# Patient Record
Sex: Female | Born: 2000
Health system: Southern US, Community
[De-identification: ages and names within clinical notes are randomized; demographics above are authoritative.]

## PROBLEM LIST (undated history)

## (undated) DIAGNOSIS — R45851 Suicidal ideations: Secondary | ICD-10-CM

## (undated) DIAGNOSIS — M5136 Other intervertebral disc degeneration, lumbar region: Secondary | ICD-10-CM

## (undated) DIAGNOSIS — Z8719 Personal history of other diseases of the digestive system: Secondary | ICD-10-CM

## (undated) DIAGNOSIS — K279 Peptic ulcer, site unspecified, unspecified as acute or chronic, without hemorrhage or perforation: Secondary | ICD-10-CM

## (undated) DIAGNOSIS — Z8669 Personal history of other diseases of the nervous system and sense organs: Secondary | ICD-10-CM

## (undated) HISTORY — PX: ESOPHAGOGASTRODUODENOSCOPY: SHX1529

## (undated) HISTORY — DX: Other intervertebral disc degeneration, lumbar region: M51.36

## (undated) HISTORY — DX: Personal history of other diseases of the digestive system: Z87.19

## (undated) HISTORY — PX: WISDOM TOOTH EXTRACTION: SHX21

## (undated) HISTORY — PX: CHOLECYSTECTOMY: SHX55

## (undated) HISTORY — DX: Peptic ulcer, site unspecified, unspecified as acute or chronic, without hemorrhage or perforation: K27.9

## (undated) HISTORY — DX: Suicidal ideations: R45.851

---
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
## 2018-01-03 ENCOUNTER — Ambulatory Visit (INDEPENDENT_AMBULATORY_CARE_PROVIDER_SITE_OTHER): Payer: BLUE CROSS/BLUE SHIELD | Admitting: Physician Assistant

## 2018-01-03 ENCOUNTER — Encounter: Payer: Self-pay | Admitting: Physician Assistant

## 2018-01-03 VITALS — BP 116/79 | HR 91 | Wt 144.0 lb

## 2018-01-03 DIAGNOSIS — F418 Other specified anxiety disorders: Secondary | ICD-10-CM

## 2018-01-03 DIAGNOSIS — F322 Major depressive disorder, single episode, severe without psychotic features: Secondary | ICD-10-CM | POA: Diagnosis not present

## 2018-01-03 DIAGNOSIS — Z7689 Persons encountering health services in other specified circumstances: Secondary | ICD-10-CM

## 2018-01-03 DIAGNOSIS — Z8719 Personal history of other diseases of the digestive system: Secondary | ICD-10-CM | POA: Insufficient documentation

## 2018-01-03 DIAGNOSIS — R45851 Suicidal ideations: Secondary | ICD-10-CM | POA: Insufficient documentation

## 2018-01-03 DIAGNOSIS — R1013 Epigastric pain: Secondary | ICD-10-CM | POA: Diagnosis not present

## 2018-01-03 DIAGNOSIS — F17200 Nicotine dependence, unspecified, uncomplicated: Secondary | ICD-10-CM | POA: Insufficient documentation

## 2018-01-03 DIAGNOSIS — Z23 Encounter for immunization: Secondary | ICD-10-CM

## 2018-01-03 DIAGNOSIS — Z8669 Personal history of other diseases of the nervous system and sense organs: Secondary | ICD-10-CM | POA: Insufficient documentation

## 2018-01-03 DIAGNOSIS — F17290 Nicotine dependence, other tobacco product, uncomplicated: Secondary | ICD-10-CM | POA: Diagnosis not present

## 2018-01-03 HISTORY — DX: Suicidal ideations: R45.851

## 2018-01-03 MED ORDER — CLONAZEPAM 0.5 MG PO TABS: 0.5000 mg | ORAL_TABLET | Freq: Two times a day (BID) | ORAL | 0 refills | Status: DC | PRN

## 2018-01-03 MED ORDER — ESCITALOPRAM OXALATE 5 MG PO TABS
ORAL_TABLET | ORAL | 0 refills | Status: DC
Start: 2018-01-03 — End: 2018-01-30
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
## 2018-01-03 NOTE — Patient Instructions (Signed)
Safety Plan: if having self-harm or suicidal thoughts Contact [facility] [phone] select option for triage [facility] Crisis Hotline 3808889953 National Suicide Hotline 1-800-SUICIDE If in immediate danger of harming yourself, go to the nearest emergency room or call 911    Suicidal Feelings: How to Help Yourself Suicide is the taking of one's own life. If you feel as though life is getting too tough to handle and are thinking about suicide, get help right away. To get help:  Call your local emergency services (911 in the [place]  Call a suicide hotline to speak with a trained counselor who understands how you are feeling. The following is a list of suicide hotlines in the [place]. For a list of hotlines in [place], visit InkDistributor.it. ? 1-800-273-TALK 701-707-6048). ? 1-800-SUICIDE 647-343-5880). ? 620-451-4131. This is a hotline for Spanish speakers. ? 1-800-799-4TTY (337)257-6802). This is a hotline for TTY users. ? 1-866-4-U-TREVOR (915)821-1998). This is a hotline for lesbian, gay, bisexual, transgender, or questioning youth.  Contact a crisis center or a local suicide prevention center. To find a crisis center or suicide prevention center: ? Call your local hospital, clinic, community service organization, mental health center, social service provider, or health department. Ask for assistance in connecting to a crisis center. ? Visit https://www.patel-king.com/ for a list of crisis centers in the [place], or visit www.suicideprevention.ca/thinking-about-suicide/find-a-crisis-centre for a list of centers in [place].  Visit the following websites: ? National Suicide Prevention Lifeline: www.suicidepreventionlifeline.org ? Hopeline: www.hopeline.com ? [employer] for Suicide Prevention: https://www.ayers.com/ ? [employer] (for lesbian, gay, bisexual, transgender, or  questioning youth): www.thetrevorproject.org  How can I help myself feel better?  Promise yourself that you will not do anything drastic when you have suicidal feelings. Remember, there is hope. Many people have gotten through suicidal thoughts and feelings, and you will, too. You may have gotten through them before, and this proves that you can get through them again.  Let family, friends, teachers, or counselors know how you are feeling. Try not to isolate yourself from those who care about you. Remember, they will want to help you. Talk with someone every day, even if you do not feel sociable. Face-to-face conversation is best.  Call a mental health professional and see one regularly.  Visit your primary health care provider every year.  Eat a well-balanced diet, and space your meals so you eat regularly.  Get plenty of rest.  Avoid alcohol and drugs, and remove them from your home. They will only make you feel worse.  If you are thinking of taking a lot of medicine, give your medicine to someone who can give it to you one day at a time. If you are on antidepressants and are concerned you will overdose, let your health care provider know so he or she can give you safer medicines. Ask your mental health professional about the possible side effects of any medicines you are taking.  Remove weapons, poisons, knives, and anything else that could harm you from your home.  Try to stick to routines. Follow a schedule every day. Put self-care on your schedule.  Make a list of realistic goals, and cross them off when you achieve them. Accomplishments give a sense of worth.  Wait until you are feeling better before doing the things you find difficult or unpleasant.  Exercise if you are able. You will feel better if you exercise for even a half hour each day.  Go out in the sun or into nature. This  will help you recover from depression faster. If you have a favorite place to walk, go  there.  Do the things that have always given you pleasure. Play your favorite music, read a good book, paint a picture, play your favorite instrument, or do anything else that takes your mind off your depression if it is safe to do.  Keep your living space well lit.  When you are feeling well, write yourself a letter about tips and support that you can read when you are not feeling well.  Remember that life's difficulties can be sorted out with help. Conditions can be treated. You can work on thoughts and strategies that serve you well. This information is not intended to replace advice given to you by your health care provider. Make sure you discuss any questions you have with your health care provider. Document Released: 08/28/2002 Document Revised: 10/21/2015 Document Reviewed: 06/18/2013 Elsevier Interactive Patient Education  [employer].

## 2018-01-07 ENCOUNTER — Encounter: Payer: Self-pay | Admitting: Physician Assistant

## 2018-01-08 ENCOUNTER — Encounter: Payer: Self-pay | Admitting: Physician Assistant

## 2018-01-10 ENCOUNTER — Ambulatory Visit (INDEPENDENT_AMBULATORY_CARE_PROVIDER_SITE_OTHER): Payer: BLUE CROSS/BLUE SHIELD | Admitting: Physician Assistant

## 2018-01-10 ENCOUNTER — Encounter: Payer: Self-pay | Admitting: Physician Assistant

## 2018-01-10 VITALS — BP 110/76 | HR 98 | Wt 141.0 lb

## 2018-01-10 DIAGNOSIS — F418 Other specified anxiety disorders: Secondary | ICD-10-CM

## 2018-01-10 DIAGNOSIS — Z113 Encounter for screening for infections with a predominantly sexual mode of transmission: Secondary | ICD-10-CM | POA: Diagnosis not present

## 2018-01-10 DIAGNOSIS — B9689 Other specified bacterial agents as the cause of diseases classified elsewhere: Secondary | ICD-10-CM

## 2018-01-10 DIAGNOSIS — N898 Other specified noninflammatory disorders of vagina: Secondary | ICD-10-CM | POA: Diagnosis not present

## 2018-01-10 DIAGNOSIS — F332 Major depressive disorder, recurrent severe without psychotic features: Secondary | ICD-10-CM | POA: Diagnosis not present

## 2018-01-10 DIAGNOSIS — R102 Pelvic and perineal pain unspecified side: Secondary | ICD-10-CM

## 2018-01-10 DIAGNOSIS — Z3009 Encounter for other general counseling and advice on contraception: Secondary | ICD-10-CM

## 2018-01-10 DIAGNOSIS — M79622 Pain in left upper arm: Secondary | ICD-10-CM

## 2018-01-10 DIAGNOSIS — Z8619 Personal history of other infectious and parasitic diseases: Secondary | ICD-10-CM

## 2018-01-10 DIAGNOSIS — N760 Acute vaginitis: Secondary | ICD-10-CM

## 2018-01-10 NOTE — Progress Notes (Signed)
HPI:                                                                Christina Munoz is a 17 y.o. female who presents to [facility]: Primary Care Sports Medicine today for depression follow-up  [name] presented 1 week ago with worsening depressive symptoms with suicidal ideation and severe anxity. She declined hospitalization and was started on Lexapro with a safety plan. Reports she is not having any suicidal thoughts and has not had any SI for the last week. Feels mood and anxiety has improved. She is currently using Clonazepam about once a day on average. She has been able to work full-time.  She is also concerned about a vaginal odor that has been present for about 2 weeks. Occasional mild pelvic pain. No fever, chills, nausea/vomiting, dyspareunia, dysuria. Reports history of chlamydia infection treated in May 2019, reports negative test of cure.  She is sexually active with 1 female partner and reports her partner was also treated for chlamydia. LMP 12/26/17. Uses condoms. States she felt nauseous and awful on the pill while she was in high school.   For over 1 year she has had tenderness and swelling in her left axilla. States she was told this was a lymph node at one time. Symptoms are unchanged.  Depression screen PHQ 2/9 01/03/2018  Decreased Interest 3  Down, Depressed, Hopeless 3  PHQ - 2 Score 6  Altered sleeping 3  Tired, decreased energy 3  Change in appetite 3  Feeling bad or failure about yourself  3  Trouble concentrating 3  Moving slowly or fidgety/restless 2  Suicidal thoughts 3  PHQ-9 Score 26  Difficult doing work/chores Extremely dIfficult    GAD 7 : Generalized Anxiety Score 01/03/2018  Nervous, Anxious, on Edge 3  Control/stop worrying 3  Worry too much - different things 3  Trouble relaxing 3  Restless 3  Easily annoyed or irritable 3  Afraid - awful might happen 3  Total GAD 7 Score 21  Anxiety Difficulty Extremely difficult      Past  Medical History:  Diagnosis Date  . History of acute pancreatitis   . History of gallstones   . Hx of migraine headaches   . PUD (peptic ulcer disease)    Past Surgical History:  Procedure Laterality Date  . CHOLECYSTECTOMY    . ESOPHAGOGASTRODUODENOSCOPY    . WISDOM TOOTH EXTRACTION     Social History   Tobacco Use  . Smoking status: Never Smoker  . Smokeless tobacco: Never Used  Substance Use Topics  . Alcohol use: No   family history includes Breast cancer in her paternal grandmother; Heart disease in her maternal grandmother.    ROS: negative except as noted in the HPI  Medications: Current Outpatient Medications  Medication Sig Dispense Refill  . clonazePAM (KLONOPIN) 0.5 MG tablet Take 1 tablet (0.5 mg total) by mouth 2 (two) times daily as needed for anxiety. 20 tablet 0  . EPINEPHrine 0.3 mg/0.3 mL IJ SOAJ injection INJECT 0.3 ML INTO THE MUSCLE ONCE FOR 1 DOSE  1  . escitalopram (LEXAPRO) 5 MG tablet Take 1 tablet (5 mg total) by mouth at bedtime for 3 days, THEN 2 tablets (10 mg total) at bedtime for  7 days, THEN 4 tablets (20 mg total) at bedtime for 20 days. 97 tablet 0  . pantoprazole (PROTONIX) 40 MG tablet Take by mouth.     No current facility-administered medications for this visit.    Allergies  Allergen Reactions  . Pineapple Swelling    Throat and tongue swelling  . Strawberry Extract Swelling    Throat and tongue swelling  . Gabapentin Rash       Objective:  BP 110/76   Pulse 98   Wt 141 lb (64 kg)   LMP 12/26/2017   BMI 24.20 kg/m  Gen:  alert, not ill-appearing, no distress, appropriate for age HEENT: head normocephalic without obvious abnormality, conjunctiva and cornea clear, trachea midline Pulm: Normal work of breathing, normal phonation GU: vulva without rashes or lesions, normal introitus and urethral meatus, vaginal mucosa without erythema, moderate amount of white discharge, cervix non-friable without lesions, left adnexal  tenderness, no palpable mass, no CMT, uterus non-tender and not enlarged Skin:  Left axilla: no redness or rash, the lateral axilla there is approx 0.5 x 2 cm area of fullness, no lymphadenopathy, no palpable mass, no induration Psych: appearance casual, cooperative, good eye contact, depressed mood, affect full range, smiling and laughing at appropriate times, speech is articulate, and thought processes clear and goal-directed  A chaperone was used for the GU portion of the exam, Ardell Isaacs, RMA.  No results found for this or any previous visit (from the past 72 hour(s)). No results found.    Assessment and Plan: 17 y.o. female with   .Diagnoses and all orders for this visit:  Severe episode of recurrent major depressive disorder, without psychotic features (HCC)  Vaginal odor -     SureSwab, Vaginosis/Vaginitis Plus  Routine screening for STI (sexually transmitted infection) -     SureSwab, Vaginosis/Vaginitis Plus  History of chlamydia infection  Left axillary pain -     [place] AXILLA LEFT; Future  Pelvic pain   Depression/Anxiety: no acute safety issues, [name] appears to be improving, she was smiling today and denies any SI. Too early to determine if she is responding to Lexapro. She will increase to 15 mg today. We discussed plan to use Clonazepam as a bridge until her Lexapro is therapeutic, and then this will be reserved prn for panic attacks. She expressed understanding. She will send me a MyChart message in 1 week and follow-up in the office in 3 weeks.  Vaginal odor: benign pelvic exam. There was mild left adnexal tenderness. This could be physiologic due to ovulation, so we will monitor. Sureswab pending to r/o vaginitis. Counseled on general measures for vaginitis  She was also counseled briefly on contraception options. She does not want an OCP or IUD. She was provided with list of contraceptive options and will follow-up at next appt  Axillary tenderness/swelling: I  am not able to appreciate any lymphadenopathy or palpable mass on exam. Given symptoms have been present for over 1 year, will obtain US  Patient education and anticipatory guidance given Patient agrees with treatment plan Follow-up in 3 weeks or sooner as needed if symptoms worsen or fail to improve  Levonne Hubert PA-C

## 2018-01-10 NOTE — Patient Instructions (Addendum)
For mood/anxiety - increase your Lexapro to 15 mg beginning tonight. Continue at this dose for the next 2 weeks - send me a MyChart message  - follow-up in office in 3 weeks  For pelvic pain - send me a MyChart message in 1 week regarding your left lower abdominal pain  Vaginitis Vaginitis is a condition in which the vaginal tissue swells and becomes red (inflamed). This condition is most often caused by a change in the normal balance of bacteria and yeast that live in the vagina. This change causes an overgrowth of certain bacteria or yeast, which causes the inflammation. There are different types of vaginitis, but the most common types are:  Bacterial vaginosis.  Yeast infection (candidiasis).  Trichomoniasis vaginitis. This is a sexually transmitted disease (STD).  Viral vaginitis.  Atrophic vaginitis.  Allergic vaginitis.  What are the causes? The cause of this condition depends on the type of vaginitis. It can be caused by:  Bacteria (bacterial vaginosis).  Yeast, which is a fungus (yeast infection).  A parasite (trichomoniasis vaginitis).  A virus (viral vaginitis).  Low hormone levels (atrophic vaginitis). Low hormone levels can occur during pregnancy, breastfeeding, or after menopause.  Irritants, such as bubble baths, scented tampons, and feminine sprays (allergic vaginitis).  Other factors can change the normal balance of the yeast and bacteria that live in the vagina. These include:  Antibiotic medicines.  Poor hygiene.  Diaphragms, vaginal sponges, spermicides, birth control pills, and intrauterine devices (IUD).  Sex.  Infection.  Uncontrolled diabetes.  A weakened defense (immune) system.  What increases the risk? This condition is more likely to develop in women who:  Smoke.  Use vaginal douches, scented tampons, or scented sanitary pads.  Wear tight-fitting pants.  Wear thong underwear.  Use oral birth control pills or an IUD.  Have  sex without a condom.  Have multiple sex partners.  Have an STD.  Frequently use the spermicide nonoxynol-9.  Eat lots of foods high in sugar.  Have uncontrolled diabetes.  Have low estrogen levels.  Have a weakened immune system from an immune disorder or medical treatment.  Are pregnant or breastfeeding.  What are the signs or symptoms? Symptoms vary depending on the cause of the vaginitis. Common symptoms include:  Abnormal vaginal discharge. ? The discharge is white, gray, or yellow with bacterial vaginosis. ? The discharge is thick, white, and cheesy with a yeast infection. ? The discharge is frothy and yellow or greenish with trichomoniasis.  A bad vaginal smell. The smell is fishy with bacterial vaginosis.  Vaginal itching, pain, or swelling.  Sex that is painful.  Pain or burning when urinating.  Sometimes there are no symptoms. How is this diagnosed? This condition is diagnosed based on your symptoms and medical history. A physical exam, including a pelvic exam, will also be done. You may also have other tests, including:  Tests to determine the pH level (acidity or alkalinity) of your vagina.  A whiff test, to assess the odor that results when a sample of your vaginal discharge is mixed with a potassium hydroxide solution.  Tests of vaginal fluid. A sample will be examined under a microscope.  How is this treated? Treatment varies depending on the type of vaginitis you have. Your treatment may include:  Antibiotic creams or pills to treat bacterial vaginosis and trichomoniasis.  Antifungal medicines, such as vaginal creams or suppositories, to treat a yeast infection.  Medicine to ease discomfort if you have viral vaginitis. Your sexual partner  should also be treated.  Estrogen delivered in a cream, pill, suppository, or vaginal ring to treat atrophic vaginitis. If vaginal dryness occurs, lubricants and moisturizing creams may help. You may need to avoid  scented soaps, sprays, or douches.  Stopping use of a product that is causing allergic vaginitis. Then using a vaginal cream to treat the symptoms.  Follow these instructions at home: Lifestyle  Keep your genital area clean and dry. Avoid soap, and only rinse the area with water.  Do not douche or use tampons until your health care provider says it is okay to do so. Use sanitary pads, if needed.  Do not have sex until your health care provider approves. When you can return to sex, practice safe sex and use condoms.  Wipe from front to back. This avoids the spread of bacteria from the rectum to the vagina. General instructions  Take over-the-counter and prescription medicines only as told by your health care provider.  If you were prescribed an antibiotic medicine, take or use it as told by your health care provider. Do not stop taking or using the antibiotic even if you start to feel better.  Keep all follow-up visits as told by your health care provider. This is important. How is this prevented?  Use mild, non-scented products. Do not use things that can irritate the vagina, such as fabric softeners. Avoid the following products if they are scented: ? Feminine sprays. ? Detergents. ? Tampons. ? Feminine hygiene products. ? Soaps or bubble baths.  Let air reach your genital area. ? Wear cotton underwear to reduce moisture buildup. ? Avoid wearing underwear while you sleep. ? Avoid wearing tight pants and underwear or nylons without a cotton panel. ? Avoid wearing thong underwear.  Take off any wet clothing, such as bathing suits, as soon as possible.  Practice safe sex and use condoms. Contact a health care provider if:  You have abdominal pain.  You have a fever.  You have symptoms that last for more than 2-3 days. Get help right away if:  You have a fever and your symptoms suddenly get worse. Summary  Vaginitis is a condition in which the vaginal tissue becomes  inflamed.This condition is most often caused by a change in the normal balance of bacteria and yeast that live in the vagina.  Treatment varies depending on the type of vaginitis you have.  Do not douche, use tampons , or have sex until your health care provider approves. When you can return to sex, practice safe sex and use condoms. This information is not intended to replace advice given to you by your health care provider. Make sure you discuss any questions you have with your health care provider. Document Released: 12/19/2006 Document Revised: 03/29/2016 Document Reviewed: 03/29/2016 Elsevier Interactive Patient Education  [employer].

## 2018-01-11 ENCOUNTER — Encounter: Payer: Self-pay | Admitting: Physician Assistant

## 2018-01-11 DIAGNOSIS — Z3009 Encounter for other general counseling and advice on contraception: Secondary | ICD-10-CM | POA: Insufficient documentation

## 2018-01-11 DIAGNOSIS — M79622 Pain in left upper arm: Secondary | ICD-10-CM | POA: Insufficient documentation

## 2018-01-13 ENCOUNTER — Encounter: Payer: Self-pay | Admitting: Physician Assistant

## 2018-01-15 ENCOUNTER — Encounter: Payer: Self-pay | Admitting: Physician Assistant

## 2018-01-15 DIAGNOSIS — F418 Other specified anxiety disorders: Secondary | ICD-10-CM

## 2018-01-15 LAB — SURESWAB, VAGINOSIS/VAGINITIS PLUS
ATOPOBIUM VAGINAE: 7.4 Log (cells/mL)
C. PARAPSILOSIS, DNA: NOT DETECTED
C. albicans, DNA: NOT DETECTED
C. glabrata, DNA: NOT DETECTED
C. trachomatis RNA, TMA: NOT DETECTED
C. tropicalis, DNA: NOT DETECTED
Gardnerella vaginalis: >8 Log (cells/mL)
LACTOBACILLUS SPECIES: NOT DETECTED
MEGASPHAERA SPECIES: 8 Log (cells/mL)
N. GONORRHOEAE RNA, TMA: NOT DETECTED
Trichomonas vaginalis RNA: NOT DETECTED

## 2018-01-15 MED ORDER — METRONIDAZOLE 500 MG PO TABS: 500.0000 mg | ORAL_TABLET | Freq: Two times a day (BID) | ORAL | 0 refills | Status: AC

## 2018-01-15 MED ORDER — CLONAZEPAM 0.5 MG PO TABS: 0.5000 mg | ORAL_TABLET | Freq: Two times a day (BID) | ORAL | 0 refills | Status: DC | PRN

## 2018-01-15 NOTE — Addendum Note (Signed)
Addended by: Gena Fray E on: 01/15/2018 08:55 AM   Modules accepted: Orders

## 2018-01-16 ENCOUNTER — Encounter: Payer: Self-pay | Admitting: Physician Assistant

## 2018-01-17 ENCOUNTER — Encounter: Payer: Self-pay | Admitting: Physician Assistant

## 2018-01-17 DIAGNOSIS — R300 Dysuria: Secondary | ICD-10-CM

## 2018-01-18 ENCOUNTER — Encounter: Payer: Self-pay | Admitting: Physician Assistant

## 2018-01-18 ENCOUNTER — Ambulatory Visit (INDEPENDENT_AMBULATORY_CARE_PROVIDER_SITE_OTHER): Payer: BLUE CROSS/BLUE SHIELD | Admitting: Physician Assistant

## 2018-01-18 VITALS — BP 112/71 | HR 85 | Temp 98.6°F | Wt 139.0 lb

## 2018-01-18 DIAGNOSIS — R112 Nausea with vomiting, unspecified: Secondary | ICD-10-CM | POA: Diagnosis not present

## 2018-01-18 DIAGNOSIS — R822 Biliuria: Secondary | ICD-10-CM

## 2018-01-18 DIAGNOSIS — R300 Dysuria: Secondary | ICD-10-CM

## 2018-01-18 DIAGNOSIS — R82998 Other abnormal findings in urine: Secondary | ICD-10-CM

## 2018-01-18 DIAGNOSIS — N281 Cyst of kidney, acquired: Secondary | ICD-10-CM

## 2018-01-18 LAB — POCT URINALYSIS DIPSTICK
Blood, UA: NEGATIVE
GLUCOSE UA: NEGATIVE
NITRITE UA: NEGATIVE
PROTEIN UA: POSITIVE — AB
SPEC GRAV UA: 1.02 (ref 1.010–1.025)
Urobilinogen, UA: 1 E.U./dL
pH, UA: 7 (ref 5.0–8.0)

## 2018-01-18 LAB — POCT URINE PREGNANCY: Preg Test, Ur: NEGATIVE

## 2018-01-18 MED ORDER — CEFTRIAXONE SODIUM 1 G IJ SOLR
1.0000 g | Freq: Once | INTRAMUSCULAR | Status: AC
  Administered 2018-01-18: 1 g via INTRAMUSCULAR

## 2018-01-18 MED ORDER — CEPHALEXIN 500 MG PO CAPS: 500.0000 mg | ORAL_CAPSULE | Freq: Two times a day (BID) | ORAL | 0 refills | Status: AC

## 2018-01-18 MED ORDER — ONDANSETRON 4 MG PO TBDP
4.0000 mg | ORAL_TABLET | Freq: Once | ORAL | Status: AC
  Administered 2018-01-18: 4 mg via ORAL

## 2018-01-18 MED ORDER — ONDANSETRON 4 MG PO TBDP: 4.0000 mg | ORAL_TABLET | Freq: Three times a day (TID) | ORAL | 0 refills | Status: DC | PRN

## 2018-01-18 NOTE — Progress Notes (Signed)
HPI:                                                                Christina Munoz is a 17 y.o. female who presents to [name]: [facility] Medicine today for fever and vomiting  Pleasant 17 yo F with PMH of acute pancreatitis, gallstones s/p cholecystectomy presents with several days of fever up to 101, nausea, vomiting, LLQ abdominal discomfort and dysuria. Reports fever of 100.6 earlier today and several episodes of emesis. She was sent home from work on 01/16/18 She denies change in bowel habits, hematochezia, melena, hematemesis, hematuria, flank pain. She is currently being treated for BV with Metronidazole. STI testing last week was negative. Denies any new vaginal symptoms. Reports history of a renal cyst. CT abdomen 10/2017 shows right renal cortical cyst   Past Medical History:  Diagnosis Date  . History of acute pancreatitis   . History of gallstones   . Hx of migraine headaches   . PUD (peptic ulcer disease)    Past Surgical History:  Procedure Laterality Date  . CHOLECYSTECTOMY    . ESOPHAGOGASTRODUODENOSCOPY    . WISDOM TOOTH EXTRACTION     Social History   Tobacco Use  . Smoking status: Never Smoker  . Smokeless tobacco: Never Used  Substance Use Topics  . Alcohol use: No   family history includes Breast cancer in her paternal grandmother; Heart disease in her maternal grandmother.    ROS: negative except as noted in the HPI  Medications: Current Outpatient Medications  Medication Sig Dispense Refill  . cephALEXin (KEFLEX) 500 MG capsule Take 1 capsule (500 mg total) by mouth 2 (two) times daily for 7 days. 14 capsule 0  . clonazePAM (KLONOPIN) 0.5 MG tablet Take 1 tablet (0.5 mg total) by mouth every 12 (twelve) hours as needed for anxiety. 20 tablet 0  . EPINEPHrine 0.3 mg/0.3 mL IJ SOAJ injection INJECT 0.3 ML INTO THE MUSCLE ONCE FOR 1 DOSE  1  . escitalopram (LEXAPRO) 5 MG tablet Take 1 tablet (5 mg total) by mouth at bedtime  for 3 days, THEN 2 tablets (10 mg total) at bedtime for 7 days, THEN 4 tablets (20 mg total) at bedtime for 20 days. 97 tablet 0  . metroNIDAZOLE (FLAGYL) 500 MG tablet Take 1 tablet (500 mg total) by mouth 2 (two) times daily for 7 days. 14 tablet 0  . ondansetron (ZOFRAN-ODT) 4 MG disintegrating tablet Take 1 tablet (4 mg total) by mouth every 8 (eight) hours as needed for nausea or vomiting. 10 tablet 0  . pantoprazole (PROTONIX) 40 MG tablet Take by mouth.     No current facility-administered medications for this visit.    Allergies  Allergen Reactions  . Pineapple Swelling    Throat and tongue swelling  . Strawberry Extract Swelling    Throat and tongue swelling  . Gabapentin Rash       Objective:  BP 112/71   Pulse 85   Temp 98.6 F (37 C) (Oral)   Wt 139 lb (63 kg)   LMP 12/26/2017   BMI 23.86 kg/m  Gen:  alert, not ill-appearing, no distress, appropriate for age HEENT: head normocephalic without obvious abnormality, conjunctiva and cornea clear, trachea midline Pulm: Normal work  of breathing, normal phonation, clear to auscultation bilaterally, no wheezes, rales or rhonchi CV: Normal rate, regular rhythm, s1 and s2 distinct, no murmurs, clicks or rubs  GI: abdomen soft, non-tender, no guarding or rigidity, no CVA tenderness Neuro: alert and oriented x 3, no tremor MSK: extremities atraumatic, normal gait and station Skin: intact, no rashes on exposed skin, no jaundice, no cyanosis   Results for orders placed or performed in visit on 01/18/18 (from the past 72 hour(s))  POCT Urinalysis Dipstick     Status: Abnormal   Collection Time: 01/18/18  2:37 PM  Result Value Ref Range   Color, UA brown    Clarity, UA clear    Glucose, UA Negative Negative   Bilirubin, UA small    Ketones, UA trace    Spec Grav, UA 1.020 1.010 - 1.025   Blood, UA negative    pH, UA 7.0 5.0 - 8.0   Protein, UA Positive (A) Negative   Urobilinogen, UA 1.0 0.2 or 1.0 E.U./dL   Nitrite, UA  negative    Leukocytes, UA Small (1+) (A) Negative   Appearance     Odor    POCT urine pregnancy     Status: Normal   Collection Time: 01/18/18  2:37 PM  Result Value Ref Range   Preg Test, Ur Negative Negative   No results found.  Procedure note: IV insertion  Inserted #20 gauge, 1-inch BD Insyte angiocatheter in the left antecubital vein on attempt. Good blood return. Flushed with normal saline.      Assessment and Plan: 17 y.o. female with   .[name] was seen today for emesis.  Diagnoses and all orders for this visit:  Non-intractable vomiting with nausea, unspecified vomiting type -     POCT Urinalysis Dipstick -     POCT urine pregnancy -     Urine Culture -     ondansetron (ZOFRAN-ODT) 4 MG disintegrating tablet; Take 1 tablet (4 mg total) by mouth every 8 (eight) hours as needed for nausea or vomiting. -     ondansetron (ZOFRAN-ODT) disintegrating tablet 4 mg  Dysuria -     POCT Urinalysis Dipstick -     POCT urine pregnancy -     Urine Culture -     cephALEXin (KEFLEX) 500 MG capsule; Take 1 capsule (500 mg total) by mouth 2 (two) times daily for 7 days. -     cefTRIAXone (ROCEPHIN) injection 1 g  Urine leukocytes -     cefTRIAXone (ROCEPHIN) injection 1 g  Bilirubinuria   Afebrile, no tachycardia, no tachypnea, benign abdominal exam Urine appeared very dark brown and cloudy, positive for leukocytes, protein and bilirubin Urine hCG negative  She was given 1L normal saline bolus in office and was able to void, urine was pale yellow She was also given PO zofran and did not have any episodes of emesis in the office Given her symptoms of dysuria with fever, nausea, and vomiting treating empirically for acute pyelonephritis  Urine culture pending ED return precautions discussed Work note provided for remainder of this week    Patient education and anticipatory guidance given Patient agrees with treatment plan Follow-up as needed if symptoms worsen or fail  to improve  [name] PA-C

## 2018-01-18 NOTE — Patient Instructions (Signed)
Pyelonephritis, Adult Pyelonephritis is a kidney infection. The kidneys are organs that help clean your blood by moving waste out of your blood and into your pee (urine). This infection can happen quickly, or it can last for a long time. In most cases, it clears up with treatment and does not cause other problems. Follow these instructions at home: Medicines  Take over-the-counter and prescription medicines only as told by your doctor.  Take your antibiotic medicine as told by your doctor. Do not stop taking the medicine even if you start to feel better. General instructions  Drink enough fluid to keep your pee clear or pale yellow.  Avoid caffeine, tea, and carbonated drinks.  Pee (urinate) often. Avoid holding in pee for long periods of time.  Pee before and after sex.  After pooping (having a bowel movement), women should wipe from front to back. Use each tissue only once.  Keep all follow-up visits as told by your doctor. This is important. Contact a doctor if:  You do not feel better after 2 days.  Your symptoms get worse.  You have a fever. Get help right away if:  You cannot take your medicine or drink fluids as told.  You have chills and shaking.  You throw up (vomit).  You have very bad pain in your side (flank) or back.  You feel very weak or you pass out (faint). This information is not intended to replace advice given to you by your health care provider. Make sure you discuss any questions you have with your health care provider. Document Released: 03/31/2004 Document Revised: 07/30/2015 Document Reviewed: 06/16/2014 Elsevier Interactive Patient Education  2018 [employer] Inc[employer]  

## 2018-01-19 ENCOUNTER — Encounter: Payer: Self-pay | Admitting: Physician Assistant

## 2018-01-19 DIAGNOSIS — N281 Cyst of kidney, acquired: Secondary | ICD-10-CM | POA: Insufficient documentation

## 2018-01-19 LAB — URINE CULTURE
MICRO NUMBER:: 91374918
SPECIMEN QUALITY:: ADEQUATE

## 2018-01-22 ENCOUNTER — Encounter: Payer: Self-pay | Admitting: Physician Assistant

## 2018-01-22 DIAGNOSIS — R112 Nausea with vomiting, unspecified: Secondary | ICD-10-CM

## 2018-01-22 NOTE — Telephone Encounter (Signed)
Left VM for Pt to see how she is feeling. Inquired if she was able to take the zofran and if it has helped. Recommended ED if she feels she cannot wait until appt tomorrow. Requested callback, callback information provided.  

## 2018-01-22 NOTE — Telephone Encounter (Signed)
Left VM for Pt to see how she is feeling. Inquired if she was able to take the zofran and if it has helped. Recommended ED if she feels she cannot wait until appt tomorrow. Requested callback, callback information provided.

## 2018-01-23 ENCOUNTER — Encounter: Payer: Self-pay | Admitting: Physician Assistant

## 2018-01-23 ENCOUNTER — Ambulatory Visit (INDEPENDENT_AMBULATORY_CARE_PROVIDER_SITE_OTHER): Payer: BLUE CROSS/BLUE SHIELD | Admitting: Physician Assistant

## 2018-01-23 ENCOUNTER — Ambulatory Visit (INDEPENDENT_AMBULATORY_CARE_PROVIDER_SITE_OTHER): Payer: BLUE CROSS/BLUE SHIELD

## 2018-01-23 VITALS — BP 109/74 | HR 76 | Temp 98.3°F | Wt 143.0 lb

## 2018-01-23 DIAGNOSIS — M5136 Other intervertebral disc degeneration, lumbar region: Secondary | ICD-10-CM

## 2018-01-23 DIAGNOSIS — K5900 Constipation, unspecified: Secondary | ICD-10-CM | POA: Insufficient documentation

## 2018-01-23 DIAGNOSIS — R1084 Generalized abdominal pain: Secondary | ICD-10-CM

## 2018-01-23 DIAGNOSIS — R1110 Vomiting, unspecified: Secondary | ICD-10-CM | POA: Diagnosis not present

## 2018-01-23 DIAGNOSIS — R112 Nausea with vomiting, unspecified: Secondary | ICD-10-CM

## 2018-01-23 DIAGNOSIS — B2790 Infectious mononucleosis, unspecified without complication: Secondary | ICD-10-CM | POA: Insufficient documentation

## 2018-01-23 DIAGNOSIS — M51369 Other intervertebral disc degeneration, lumbar region without mention of lumbar back pain or lower extremity pain: Secondary | ICD-10-CM

## 2018-01-23 DIAGNOSIS — J029 Acute pharyngitis, unspecified: Secondary | ICD-10-CM | POA: Diagnosis not present

## 2018-01-23 DIAGNOSIS — M5137 Other intervertebral disc degeneration, lumbosacral region: Secondary | ICD-10-CM | POA: Diagnosis not present

## 2018-01-23 DIAGNOSIS — K2970 Gastritis, unspecified, without bleeding: Secondary | ICD-10-CM | POA: Diagnosis not present

## 2018-01-23 HISTORY — DX: Other intervertebral disc degeneration, lumbar region: M51.36

## 2018-01-23 HISTORY — DX: Other intervertebral disc degeneration, lumbar region without mention of lumbar back pain or lower extremity pain: M51.369

## 2018-01-23 LAB — COMPLETE METABOLIC PANEL WITH GFR
AG RATIO: 2 (calc) (ref 1.0–2.5)
ALKALINE PHOSPHATASE (APISO): 50 U/L (ref 47–176)
ALT: 10 U/L (ref 5–32)
AST: 12 U/L (ref 12–32)
Albumin: 4.2 g/dL (ref 3.6–5.1)
BILIRUBIN TOTAL: 0.4 mg/dL (ref 0.2–1.1)
BUN: 10 mg/dL (ref 7–20)
CO2: 26 mmol/L (ref 20–32)
Calcium: 9.2 mg/dL (ref 8.9–10.4)
Chloride: 106 mmol/L (ref 98–110)
Creat: 0.84 mg/dL (ref 0.50–1.00)
GFR, Est African American: 117 mL/min/{1.73_m2} (ref >=60)
GFR, Est Non African American: 101 mL/min/{1.73_m2} (ref >=60)
GLOBULIN: 2.1 g/dL (ref 2.0–3.8)
Glucose, Bld: 95 mg/dL (ref 65–99)
POTASSIUM: 3.7 mmol/L — AB (ref 3.8–5.1)
SODIUM: 139 mmol/L (ref 135–146)
Total Protein: 6.3 g/dL (ref 6.3–8.2)

## 2018-01-23 LAB — CBC WITH DIFFERENTIAL/PLATELET
Basophils Absolute: 40 cells/uL (ref 0–200)
Basophils Relative: 0.6 %
Eosinophils Absolute: 107 cells/uL (ref 15–500)
Eosinophils Relative: 1.6 %
HCT: 36.6 % (ref 35.0–45.0)
Hemoglobin: 12.1 g/dL (ref 11.7–15.5)
Lymphs Abs: 2606 cells/uL (ref 850–3900)
MCH: 28 pg (ref 27.0–33.0)
MCHC: 33.1 g/dL (ref 32.0–36.0)
MCV: 84.7 fL (ref 80.0–100.0)
MPV: 11.4 fL (ref 7.5–12.5)
Monocytes Relative: 8.4 %
NEUTROS PCT: 50.5 %
Neutro Abs: 3384 cells/uL (ref 1500–7800)
Platelets: 279 10*3/uL (ref 140–400)
RBC: 4.32 10*6/uL (ref 3.80–5.10)
RDW: 12.4 % (ref 11.0–15.0)
TOTAL LYMPHOCYTE: 38.9 %
WBC: 6.7 10*3/uL (ref 3.8–10.8)
WBCMIX: 563 {cells}/uL (ref 200–950)

## 2018-01-23 LAB — EPSTEIN-BARR VIRUS VCA ANTIBODY PANEL
EBV NA IgG: >600 U/mL — ABNORMAL HIGH
EBV VCA IGG: 366 U/mL — AB
EBV VCA IGM: 55.8 U/mL — AB

## 2018-01-23 LAB — LIPASE: Lipase: 33 U/L (ref 7–60)

## 2018-01-23 MED ORDER — SUCRALFATE 1 G PO TABS: 1.0000 g | ORAL_TABLET | Freq: Four times a day (QID) | ORAL | 0 refills | Status: DC

## 2018-01-23 MED ORDER — IOPAMIDOL (ISOVUE-300) INJECTION 61%
100.0000 mL | Freq: Once | INTRAVENOUS | Status: AC | PRN
  Administered 2018-01-23: 100 mL via INTRAVENOUS

## 2018-01-23 NOTE — Addendum Note (Signed)
Addended by: Gena FrayUMMINGS, CHARLEY E on: 01/23/2018 01:02 PM   Modules accepted: Orders

## 2018-01-23 NOTE — Progress Notes (Signed)
HPI:                                                                Christina Munoz is a 17 y.o. female who presents to [name]: Primary Care [facility] Medicine today for fever/abd pain  17 yo F with PMH of choledocholithiasis/cholecystitis s/p cholecystectomy (Aug 2019) and PUD returns today for follow-up. She was treated empirically for suspected acute pyelonephritis on Friday with Ceftriaxone and Keflex. Urine hCG was negative. UA positive for leuks, protein and bilirubin. Urine culture showed colonization <10,000 CFU.  Reports she is no better. Still having intermittent fevers up to 101, malaise, nausea/vomiting, and dark-colored urine. Today she also endorses a mild sore throat and generalized abdominal pain. Denies coffee-ground emesis/hematemesis, change in bowel habits, melena, hematochezia.  She was unable to afford to have her labs drawn yesterday, so we do not have results of CBC, CMP or lipase.    Past Medical History:  Diagnosis Date  . History of acute pancreatitis   . History of gallstones   . Hx of migraine headaches   . PUD (peptic ulcer disease)    Past Surgical History:  Procedure Laterality Date  . CHOLECYSTECTOMY    . ESOPHAGOGASTRODUODENOSCOPY    . WISDOM TOOTH EXTRACTION     Social History   Tobacco Use  . Smoking status: Never Smoker  . Smokeless tobacco: Never Used  Substance Use Topics  . Alcohol use: No   family history includes Breast cancer in her paternal grandmother; Heart disease in her maternal grandmother.    ROS: negative except as noted in the HPI  Medications: Current Outpatient Medications  Medication Sig Dispense Refill  . cephALEXin (KEFLEX) 500 MG capsule Take 1 capsule (500 mg total) by mouth 2 (two) times daily for 7 days. 14 capsule 0  . clonazePAM (KLONOPIN) 0.5 MG tablet Take 1 tablet (0.5 mg total) by mouth every 12 (twelve) hours as needed for anxiety. 20 tablet 0  . EPINEPHrine 0.3 mg/0.3 mL IJ SOAJ injection  INJECT 0.3 ML INTO THE MUSCLE ONCE FOR 1 DOSE  1  . escitalopram (LEXAPRO) 5 MG tablet Take 1 tablet (5 mg total) by mouth at bedtime for 3 days, THEN 2 tablets (10 mg total) at bedtime for 7 days, THEN 4 tablets (20 mg total) at bedtime for 20 days. 97 tablet 0  . ondansetron (ZOFRAN-ODT) 4 MG disintegrating tablet Take 1 tablet (4 mg total) by mouth every 8 (eight) hours as needed for nausea or vomiting. 10 tablet 0  . pantoprazole (PROTONIX) 40 MG tablet Take by mouth.     No current facility-administered medications for this visit.    Allergies  Allergen Reactions  . Pineapple Swelling    Throat and tongue swelling  . Strawberry Extract Swelling    Throat and tongue swelling  . Gabapentin Rash       Objective:  BP 109/74   Pulse 76   Temp 98.3 F (36.8 C) (Oral)   Wt 143 lb (64.9 kg)   LMP 12/26/2017   SpO2 96%   BMI 24.55 kg/m  Gen:  alert, ill-appearing, not toxic appearing, no distress, appropriate for age HEENT: head normocephalic without obvious abnormality, conjunctiva and cornea clear, oropharnx clear, no exudates, neck supple, no  cervical adenopathy, trachea midline Pulm: Normal work of breathing, normal phonation, clear to auscultation bilaterally, no wheezes, rales or rhonchi CV: Normal rate, regular rhythm, s1 and s2 distinct, no murmurs, clicks or rubs  GI: abdomen soft, non-distended, tenderness in the epigastrium and b/l lower quadrants, no rebound or guarding Neuro: alert and oriented x 3, no tremor MSK: extremities atraumatic, normal gait and station Skin: intact, no rashes on exposed skin, no jaundice, no cyanosis     No results found for this or any previous visit (from the past 72 hour(s)). No results found.    Assessment and Plan: 17 y.o. female with  .[name] was seen today for follow-up.  Diagnoses and all orders for this visit:  Generalized abdominal pain -     CT Abdomen Pelvis W Contrast  Non-intractable vomiting with nausea,  unspecified vomiting type -     Epstein-Barr virus VCA antibody panel  Sore throat -     Epstein-Barr virus VCA antibody panel   Currently afebrile, no tachypnea, no tachycardia CBC, CMP, lipase and EBV drawn in office this morning CT abdomen/pelvis pending to r/o gastritis, colitis, pancreatitis, diverticulitis, and abscess Cont Protonix 40 mg QD Cont zofran prn   Patient education and anticipatory guidance given Patient agrees with treatment plan Follow-up as needed if symptoms worsen or fail to improve  Levonne Hubertharley E.  PA-C

## 2018-01-23 NOTE — Progress Notes (Signed)
Hi Christina Munoz, Your CT scan shows that you are pretty severely constipated.  This is likely the cause of your lower abdominal pain.  However I am concerned about you having upper abdominal pain despite taking your antacid medication every day.  I think you need to follow-up with your GI doctor (Dr. Caryl NeverJue) to make sure you do not have a recurrent peptic ulcer.  Please contact him today for a follow-up appointment. In the meantime I recommend the following 1 start Miralax daily and continue on this for at least the next 3 to 5 days for constipation 2 I will send in a prescription for sucralfate.  Take this in addition to your pantoprazole.  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  You are very young for this, however it does happen.  This is the cause of your back pain.  For now I recommend staying away from over-the-counter pain relievers so as not to worsen any gastritis or peptic ulcers.  Tylenol is okay as needed.  I will also refer you to our physical therapist here in the building who can help with pain relief.

## 2018-01-26 ENCOUNTER — Ambulatory Visit
Admission: RE | Admit: 2018-01-26 | Discharge: 2018-01-26 | Disposition: A | Discharge status: Home / Self Care | Payer: BLUE CROSS/BLUE SHIELD | Source: Ambulatory Visit | Attending: Physician Assistant | Admitting: Physician Assistant

## 2018-01-26 DIAGNOSIS — M79622 Pain in left upper arm: Secondary | ICD-10-CM | POA: Diagnosis not present

## 2018-01-30 ENCOUNTER — Ambulatory Visit (INDEPENDENT_AMBULATORY_CARE_PROVIDER_SITE_OTHER): Payer: BLUE CROSS/BLUE SHIELD | Admitting: Physician Assistant

## 2018-01-30 ENCOUNTER — Encounter: Payer: Self-pay | Admitting: Physician Assistant

## 2018-01-30 VITALS — BP 111/75 | HR 78 | Temp 98.3°F | Wt 140.0 lb

## 2018-01-30 DIAGNOSIS — R102 Pelvic and perineal pain: Secondary | ICD-10-CM | POA: Diagnosis not present

## 2018-01-30 DIAGNOSIS — N941 Unspecified dyspareunia: Secondary | ICD-10-CM | POA: Diagnosis not present

## 2018-01-30 DIAGNOSIS — F3341 Major depressive disorder, recurrent, in partial remission: Secondary | ICD-10-CM | POA: Diagnosis not present

## 2018-01-30 DIAGNOSIS — F418 Other specified anxiety disorders: Secondary | ICD-10-CM

## 2018-01-30 MED ORDER — IMIPRAMINE HCL 25 MG PO TABS: ORAL_TABLET | ORAL | 1 refills | Status: DC

## 2018-01-30 MED ORDER — ESCITALOPRAM OXALATE 20 MG PO TABS: 20.0000 mg | ORAL_TABLET | Freq: Every day | ORAL | 0 refills | Status: DC

## 2018-01-30 NOTE — Patient Instructions (Signed)
Dyspareunia, Female Dyspareunia is pain that is associated with sexual activity. This can affect any part of the genitals or lower abdomen, and there are many possible causes. This condition ranges from mild to severe. Depending on the cause, dyspareunia may get better with treatment, or it may return (recur) over time. What are the causes? The cause of this condition is not always known. Possible causes include:  Cancer.  Psychological factors, such as depression, anxiety, or previous traumatic experiences.  Severe pain and tenderness of the skin around the vagina (vulva) when it is touched (vulvar vestibulitis syndrome).  Infection of the pelvis or the vulva.  Infection of the vagina.  Painful, involuntary tightening (contraction) of the vaginal muscles when anything is put inside the vagina (vaginismus).  Allergic reaction.  Ovarian cysts.  Solid growths of tissue (tumors) in the ovaries or the uterus.  Scar tissue in the ovaries, vagina, or pelvis.  Vaginal dryness.  Thinning of the tissue (atrophy) of the vulva and vagina.  Skin conditions that affect the vulva (vulvar dermatoses), such as lichen sclerosus or lichen planus.  Endometriosis.  Tubal pregnancy.  A tilted uterus.  Uterine prolapse.  Adhesions in the vagina.  Bladder problems.  Intestinal problems.  Certain medicines.  Medical conditions such as diabetes, arthritis, or thyroid disease.  What increases the risk? The following factors may make you more likely to develop this condition:  Having experienced physical or sexual trauma.  Having given birth more than once.  Taking birth control pills.  Having gone through menopause.  Having recently given birth, typically within the past 3-6 months.  Breastfeeding.  What are the signs or symptoms? The main symptom of this condition is pain in any part of the genitals or lower abdomen during or after sexual activity. This may include pain  during sexual arousal, genital stimulation, or orgasm. Pain may get worse when anything is inserted into the vagina, or when the genitals are touched in any way, such as when sitting or wearing pants. Pain can range from mild to severe, depending on the cause of the condition. In some cases, symptoms go away with treatment and return (recur) at a later date. How is this diagnosed? This condition may be diagnosed based on:  Your symptoms, including: ? Where your pain is located. ? When your pain occurs.  Your medical history.  A physical exam. This may include a pelvic exam and a Pap test. This is a screening test that is used to check for signs of cancer of the vagina, cervix, and uterus.  Tests, including: ? Blood tests. ? Ultrasound. This uses sound waves to make a picture of the area that is being tested. ? Urine culture. This test involves checking a urine sample for signs of infection. ? Culture test. This is when your health care provider uses a swab to collect a sample of vaginal fluid. The sample is checked for signs of infection. ? X-rays. ? MRI. ? CT scan. ? Laparoscopy. This is a procedure in which a small incision is made in your lower abdomen and a lighted, pencil-sized instrument (laparoscope) is passed through the incision and used to look inside your pelvis.  You may be referred to a health care provider who specializes in women's health (gynecologist). In some cases, diagnosing the cause of dyspareunia can be difficult. How is this treated? Treatment depends on the cause of your condition and your symptoms. In most cases, you may need to stop sexual activity until your symptoms   improve. Treatment may include:  Lubricants.  Kegel exercises or vaginal dilators.  Medicated skin creams.  Medicated vaginal creams.  Hormonal therapy.  Antibiotic medicine to prevent or fight infection.  Medicines that help to relieve pain.  Medicines that treat depression  (antidepressants).  Psychological counseling.  Sex therapy.  Surgery.  Follow these instructions at home: Lifestyle  Avoid tight clothing and irritating materials around your genital and abdominal area.  Use water-based lubricants as needed. Avoid oil-based lubricants.  Do not use any products that irritate you. This may include certain condoms, spermicides, lubricants, soaps, tampons, vaginal sprays, or douches.  Always practice safe sex. Talk with your health care provider about which form of birth control (contraception) is best for you.  Maintain open communication with your sexual partner. General instructions  Take over-the-counter and prescription medicines only as told by your health care provider.  If you had tests done, it is your responsibility to get your tests results. Ask your health care provider or the department performing the test when your results will be ready.  Urinate before you engage in sexual activity.  Consider joining a support group.  Keep all follow-up visits as told by your health care provider. This is important. Contact a health care provider if:  You develop vaginal bleeding after sexual intercourse.  You develop a lump at the opening of your vagina. Seek medical care even if the lump is painless.  You have: ? Abnormal vaginal discharge. ? Vaginal dryness. ? Itchiness or irritation of your vulva or vagina. ? A new rash. ? Symptoms that get worse or do not improve with treatment. ? A fever. ? Pain when you urinate. ? Blood in your urine. Get help right away if:  You develop severe pain in your abdomen during or shortly after sexual intercourse.  You pass out after having sexual intercourse. This information is not intended to replace advice given to you by your health care provider. Make sure you discuss any questions you have with your health care provider. Document Released: 03/13/2007 Document Revised: 07/03/2015 Document  Reviewed: 09/23/2014 Elsevier Interactive Patient Education  2018 [employer]  

## 2018-01-30 NOTE — Progress Notes (Signed)
HPI:                                                                Christina Munoz is a 17 y.o. female who presents to [name]: [facility] today for anxiety/depression follow-up  Since last OV: Lexapro was increased to 20 mg about 1 week ago. Reports this is helping with her mood, but she is still having significant anxiety and sleep difficulty. Still needing to use Clonazepam at least once daily She was started on Miralax for constipation and Sucralfate in addition to existing Pantoprazole prescription for upper abdominal pain. She did not take Miralax because she has been having multiple bowel movements per day. Last BM was last night She is still having lower abdominal/pelvic pain, intermittently. Reports intermittent dyspareunia with penetrative activity.    Depression screen [facility] 2/9 01/30/2018 01/03/2018  Decreased Interest 0 3  Down, Depressed, Hopeless 0 3  PHQ - 2 Score 0 6  Altered sleeping 3 3  Tired, decreased energy 3 3  Change in appetite 0 3  Feeling bad or failure about yourself  0 3  Trouble concentrating 0 3  Moving slowly or fidgety/restless 0 2  Suicidal thoughts 0 3  PHQ-9 Score 6 26  Difficult doing work/chores Extremely dIfficult Extremely dIfficult    GAD 7 : Generalized Anxiety Score 01/30/2018 01/03/2018  Nervous, Anxious, on Edge 3 3  Control/stop worrying 3 3  Worry too much - different things 3 3  Trouble relaxing 3 3  Restless 3 3  Easily annoyed or irritable 3 3  Afraid - awful might happen 3 3  Total GAD 7 Score 21 21  Anxiety Difficulty Extremely difficult Extremely difficult      Past Medical History:  Diagnosis Date  . History of acute pancreatitis   . History of gallstones   . Hx of migraine headaches   . Lumbar degenerative disc disease 01/23/2018  . PUD (peptic ulcer disease)    Past Surgical History:  Procedure Laterality Date  . CHOLECYSTECTOMY    . ESOPHAGOGASTRODUODENOSCOPY    . WISDOM  TOOTH EXTRACTION     Social History   Tobacco Use  . Smoking status: Never Smoker  . Smokeless tobacco: Never Used  Substance Use Topics  . Alcohol use: No   family history includes Breast cancer in her paternal grandmother; Heart disease in her maternal grandmother.    ROS: negative except as noted in the HPI  Medications: Current Outpatient Medications  Medication Sig Dispense Refill  . clonazePAM (KLONOPIN) 0.5 MG tablet Take 1 tablet (0.5 mg total) by mouth every 12 (twelve) hours as needed for anxiety. 20 tablet 0  . EPINEPHrine 0.3 mg/0.3 mL IJ SOAJ injection INJECT 0.3 ML INTO THE MUSCLE ONCE FOR 1 DOSE  1  . pantoprazole (PROTONIX) 40 MG tablet Take by mouth.    . sucralfate (CARAFATE) 1 g tablet Take 1 tablet (1 g total) by mouth 4 (four) times daily. 120 tablet 0  . escitalopram (LEXAPRO) 20 MG tablet Take 1 tablet (20 mg total) by mouth at bedtime. 90 tablet 0  . imipramine (TOFRANIL) 25 MG tablet Take 1 tablet (25 mg total) by mouth at bedtime for 3 days, THEN 2 tablets (50 mg total) at  bedtime for 27 days. 60 tablet 1   No current facility-administered medications for this visit.    Allergies  Allergen Reactions  . Pineapple Swelling    Throat and tongue swelling  . Strawberry Extract Swelling    Throat and tongue swelling  . Gabapentin Rash       Objective:  BP 111/75   Pulse 78   Temp 98.3 F (36.8 C) (Oral)   Wt 140 lb (63.5 kg)   SpO2 97%   BMI 24.03 kg/m  Gen:  alert, not ill-appearing, no distress, appropriate for age HEENT: head normocephalic without obvious abnormality, conjunctiva and cornea clear, trachea midline Pulm: Normal work of breathing, normal phonation, clear to auscultation bilaterally, no wheezes, rales or rhonchi CV: Normal rate, regular rhythm, s1 and s2 distinct, no murmurs, clicks or rubs  Neuro: alert and oriented x 3, no tremor MSK: extremities atraumatic, normal gait and station Skin: intact, no rashes on exposed skin, no  jaundice, no cyanosis Psych: well-groomed, cooperative, good eye contact, euthymic mood, affect mood-congruent, speech is articulate, and thought processes clear and goal-directed    No results found for this or any previous visit (from the past 72 hour(s)). No results found.    Assessment and Plan: 17 y.o. female with   .Diagnoses and all orders for this visit:  Recurrent major depressive disorder, in partial remission (HCC) -     escitalopram (LEXAPRO) 20 MG tablet; Take 1 tablet (20 mg total) by mouth at bedtime. -     imipramine (TOFRANIL) 25 MG tablet; Take 1 tablet (25 mg total) by mouth at bedtime for 3 days, THEN 2 tablets (50 mg total) at bedtime for 27 days.  Anxiety with depression -     escitalopram (LEXAPRO) 20 MG tablet; Take 1 tablet (20 mg total) by mouth at bedtime. -     imipramine (TOFRANIL) 25 MG tablet; Take 1 tablet (25 mg total) by mouth at bedtime for 3 days, THEN 2 tablets (50 mg total) at bedtime for 27 days.  Pelvic pain -     US PELVIC COMPLETE WITH TRANSVAGINAL; Future  Dyspareunia in female -     US PELVIC COMPLETE WITH TRANSVAGINAL; Future   MDD, panic disorder PHQ9=6, GAD7=21 Partial response to lexapro for mood Cont 20 mg QHS Will need to augment with another agent to control her anxiety Starting Imipramine self-titration Cont Clonazepam 0.5 mg Q12h prn  Pelvic pain - trace free pelvic fluid in cul de sac on CT abdomen/pelvis on 01/23/18 - urine culture and urine hCG negative - STI testing negative - currently being treated for constipation - history of chlamydia, treated, PID is unlikely but on the differential - will obtain pelv/TV US. If no abnormality, will refer to pelvic floor physical therapy  Patient education and anticipatory guidance given Patient agrees with treatment plan Follow-up in 1 month as needed if symptoms worsen or fail to improve  Levonne Hubertharley E. Annely Sliva PA-C

## 2018-01-31 ENCOUNTER — Encounter: Payer: Self-pay | Admitting: Physician Assistant

## 2018-01-31 MED ORDER — FLUCONAZOLE 150 MG PO TABS: 150.0000 mg | ORAL_TABLET | Freq: Once | ORAL | 0 refills | Status: AC

## 2018-02-09 ENCOUNTER — Other Ambulatory Visit: Payer: Self-pay | Admitting: Physician Assistant

## 2018-02-09 DIAGNOSIS — F418 Other specified anxiety disorders: Secondary | ICD-10-CM

## 2018-02-10 ENCOUNTER — Encounter: Payer: Self-pay | Admitting: Physician Assistant

## 2018-02-12 ENCOUNTER — Other Ambulatory Visit: Payer: Self-pay | Admitting: Physician Assistant

## 2018-02-12 DIAGNOSIS — F418 Other specified anxiety disorders: Secondary | ICD-10-CM

## 2018-02-12 MED ORDER — CLONAZEPAM 0.5 MG PO TABS: 0.5000 mg | ORAL_TABLET | Freq: Two times a day (BID) | ORAL | 0 refills | Status: DC | PRN

## 2018-02-20 ENCOUNTER — Encounter: Payer: Self-pay | Admitting: Osteopathic Medicine

## 2018-02-20 ENCOUNTER — Ambulatory Visit (INDEPENDENT_AMBULATORY_CARE_PROVIDER_SITE_OTHER): Payer: BLUE CROSS/BLUE SHIELD | Admitting: Osteopathic Medicine

## 2018-02-20 ENCOUNTER — Encounter: Payer: Self-pay | Admitting: Physician Assistant

## 2018-02-20 VITALS — BP 107/58 | HR 111 | Temp 99.1°F | Wt 140.1 lb

## 2018-02-20 DIAGNOSIS — R21 Rash and other nonspecific skin eruption: Secondary | ICD-10-CM | POA: Diagnosis not present

## 2018-02-20 DIAGNOSIS — T7840XA Allergy, unspecified, initial encounter: Secondary | ICD-10-CM

## 2018-02-20 MED ORDER — HYDROXYZINE HCL 25 MG PO TABS: 25.0000 mg | ORAL_TABLET | Freq: Three times a day (TID) | ORAL | 1 refills | Status: DC | PRN

## 2018-02-20 MED ORDER — METHYLPREDNISOLONE SODIUM SUCC 125 MG IJ SOLR
125.0000 mg | Freq: Once | INTRAMUSCULAR | Status: AC
  Administered 2018-02-20: 125 mg via INTRAMUSCULAR

## 2018-02-20 MED ORDER — PREDNISONE 10 MG (48) PO TBPK: ORAL_TABLET | Freq: Every day | ORAL | 0 refills | Status: DC

## 2018-02-20 NOTE — Telephone Encounter (Signed)
Called Pt. Advised if she is having an allergic reaction she needs to be evaluated. Pt reports she has to ask her boss first and will call clinic back. We have one opening currently, if she cannot make that she should go to [facility].

## 2018-02-20 NOTE — Progress Notes (Signed)
HPI: Christina Munoz is a 17 y.o. female who  has a past medical history of History of acute pancreatitis, History of gallstones, migraine headaches, Lumbar degenerative disc disease (01/23/2018), and PUD (peptic ulcer disease).  she presents to [facility] today, 02/20/18,  for chief complaint of: Rash - concern for allergy   Itching for a few days, red rash/blotches appeared today on neck, back, inguinal area. No new skin products or detergents, no new meds.         Past medical, surgical, social and family history reviewed and updated as necessary.   Current medication list and allergy/intolerance information reviewed:    Current Outpatient Medications  Medication Sig Dispense Refill  . clonazePAM (KLONOPIN) 0.5 MG tablet Take 1 tablet (0.5 mg total) by mouth every 12 (twelve) hours as needed for anxiety. 20 tablet 0  . EPINEPHrine 0.3 mg/0.3 mL IJ SOAJ injection INJECT 0.3 ML INTO THE MUSCLE ONCE FOR 1 DOSE  1  . escitalopram (LEXAPRO) 20 MG tablet Take 1 tablet (20 mg total) by mouth at bedtime. 90 tablet 0  . imipramine (TOFRANIL) 25 MG tablet Take 1 tablet (25 mg total) by mouth at bedtime for 3 days, THEN 2 tablets (50 mg total) at bedtime for 27 days. 60 tablet 1  . pantoprazole (PROTONIX) 40 MG tablet Take by mouth.    . sucralfate (CARAFATE) 1 g tablet Take 1 tablet (1 g total) by mouth 4 (four) times daily. 120 tablet 0   No current facility-administered medications for this visit.     Allergies  Allergen Reactions  . Pineapple Swelling    Throat and tongue swelling  . Strawberry Extract Swelling    Throat and tongue swelling  . Gabapentin Rash      Review of Systems:  Constitutional:  No  fever, +chills, No recent illness, No unintentional weight changes. No significant fatigue.   HEENT: No  headache, no vision change, no hearing change, No sore throat, No  sinus pressure  Cardiac: No  chest pain, No  pressure, No  palpitations  Respiratory:  No  shortness of breath. No  Cough  Gastrointestinal: No  abdominal pain, No  nausea, No  vomiting,  No  blood in stool, No  diarrhea  Musculoskeletal: No new myalgia/arthralgia  Skin: +Rash  Neurologic: No  weakness, No  dizziness  Exam:  BP (!) 107/58 (BP Location: Left Arm, Patient Position: Sitting, Cuff Size: Normal)   Pulse (!) 111   Temp 99.1 F (37.3 C) (Oral)   Wt 140 lb 1.6 oz (63.5 kg)   BMI 24.05 kg/m   Constitutional: VS see above. General Appearance: alert, well-developed, well-nourished, NAD  Eyes: Normal lids and conjunctive, non-icteric sclera  Ears, Nose, Mouth, Throat: MMM, Normal external inspection ears/nares/mouth/lips/gums.  Neck: No masses, trachea midline. No tenderness/mass appreciated. No lymphadenopathy  Respiratory: Normal respiratory effort. no wheeze, no rhonchi, no rales  Cardiovascular: S1/S2 normal, no murmur  Gastrointestinal: Nontender, no masses. Bowel sounds normal.  Musculoskeletal: Gait normal.   Neurological: Normal balance/coordination. No tremor.   Skin: warm, dry, intact. Maculopapular rash as per photograph, on back, some spots on arms and neck, pt reports spots in inguinal area   Psychiatric: Normal judgment/insight. Normal mood and affect.    .  ASSESSMENT/PLAN: The encounter diagnosis was Rash due to allergy.   RTC if not improving, may need biopsy  No hx anaphylaxis   Meds ordered this encounter  Medications  . predniSONE (STERAPRED UNI-PAK 48  TAB) 10 MG (48) TBPK tablet    Sig: Take by mouth daily. 12-Day taper, po. Start in 1-2 days    Dispense:  48 tablet    Refill:  0  . hydrOXYzine (ATARAX/VISTARIL) 25 MG tablet    Sig: Take 1-2 tablets (25-50 mg total) by mouth 3 (three) times daily as needed for itching.    Dispense:  30 tablet    Refill:  1  . methylPREDNISolone sodium succinate (SOLU-MEDROL) 125 mg/2 mL injection 125 mg        Visit summary with medication list  and pertinent instructions was printed for patient to review. All questions at time of visit were answered - patient instructed to contact office with any additional concerns or updates. ER/RTC precautions were reviewed with the patient.   Follow-up plan: Return if symptoms worsen or fail to improve.    Please note: voice recognition software was used to produce this document, and typos may escape review. Please contact Dr. Lyn HollingsheadAlexander for any needed clarifications.

## 2018-02-21 ENCOUNTER — Other Ambulatory Visit: Payer: Self-pay | Admitting: Physician Assistant

## 2018-02-21 DIAGNOSIS — F418 Other specified anxiety disorders: Secondary | ICD-10-CM

## 2018-02-21 DIAGNOSIS — F3341 Major depressive disorder, recurrent, in partial remission: Secondary | ICD-10-CM

## 2018-02-26 ENCOUNTER — Encounter: Payer: Self-pay | Admitting: Physician Assistant

## 2018-03-01 ENCOUNTER — Encounter: Payer: Self-pay | Admitting: Physician Assistant

## 2018-03-05 ENCOUNTER — Encounter: Payer: Self-pay | Admitting: Physician Assistant

## 2018-03-05 ENCOUNTER — Ambulatory Visit (INDEPENDENT_AMBULATORY_CARE_PROVIDER_SITE_OTHER): Payer: BLUE CROSS/BLUE SHIELD | Admitting: Physician Assistant

## 2018-03-05 VITALS — BP 110/75 | HR 110 | Temp 98.4°F | Wt 150.0 lb

## 2018-03-05 DIAGNOSIS — F411 Generalized anxiety disorder: Secondary | ICD-10-CM | POA: Insufficient documentation

## 2018-03-05 DIAGNOSIS — R000 Tachycardia, unspecified: Secondary | ICD-10-CM

## 2018-03-05 DIAGNOSIS — R1084 Generalized abdominal pain: Secondary | ICD-10-CM

## 2018-03-05 MED ORDER — DULOXETINE HCL 20 MG PO CPEP: ORAL_CAPSULE | ORAL | 0 refills | Status: DC

## 2018-03-05 MED ORDER — DICYCLOMINE HCL 20 MG PO TABS: 20.0000 mg | ORAL_TABLET | Freq: Four times a day (QID) | ORAL | 0 refills | Status: DC | PRN

## 2018-03-05 MED ORDER — ESCITALOPRAM OXALATE 5 MG PO TABS: ORAL_TABLET | ORAL | 0 refills | Status: DC

## 2018-03-05 NOTE — Progress Notes (Signed)
HPI:                                                                Christina Munoz is a 17 y.o. female [facility] today for anxiety and depression follow-up  Anxiety  Presents for follow-up visit. Symptoms include excessive worry, insomnia, irritability, nausea, nervous/anxious behavior, palpitations and panic. Patient reports no suicidal ideas. Symptoms occur constantly. The severity of symptoms is causing significant distress. The quality of sleep is fair. Nighttime awakenings: one to two.   Side effects of treatment include GI discomfort.     [facility] 03/05/2018 01/30/2018 01/03/2018  Decreased Interest 2 0 3  Down, Depressed, Hopeless 1 0 3  PHQ - 2 Score 3 0 6  Altered sleeping 3 3 3   Tired, decreased energy 3 3 3   Change in appetite 3 0 3  Feeling bad or failure about yourself  0 0 3  Trouble concentrating 0 0 3  Moving slowly or fidgety/restless 0 0 2  Suicidal thoughts 0 0 3  PHQ-9 Score 12 6 26   Difficult doing work/chores - Extremely dIfficult Extremely dIfficult    GAD 7 : Generalized Anxiety Score 03/05/2018 01/30/2018 01/03/2018  Nervous, Anxious, on Edge 3 3 3   Control/stop worrying 3 3 3   Worry too much - different things 3 3 3   Trouble relaxing 3 3 3   Restless 3 3 3   Easily annoyed or irritable 3 3 3   Afraid - awful might happen 3 3 3   Total GAD 7 Score 21 21 21   Anxiety Difficulty - Extremely difficult Extremely difficult      Past Medical History:  Diagnosis Date  . History of acute pancreatitis   . History of gallstones   . Hx of migraine headaches   . Lumbar degenerative disc disease 01/23/2018  . PUD (peptic ulcer disease)    Past Surgical History:  Procedure Laterality Date  . CHOLECYSTECTOMY    . ESOPHAGOGASTRODUODENOSCOPY    . WISDOM TOOTH EXTRACTION     Social History   Tobacco Use  . Smoking status: Never Smoker  . Smokeless tobacco: Never Used   Substance Use Topics  . Alcohol use: No   family history includes Breast cancer in her paternal grandmother; Heart disease in her maternal grandmother.    ROS: Review of Systems  Constitutional: Positive for irritability.  Cardiovascular: Positive for palpitations.  Gastrointestinal: Positive for abdominal pain (generalized) and nausea. Negative for blood in stool, constipation (stooling 1-2 times per day) and melena.  Psychiatric/Behavioral: Negative for suicidal ideas. The patient is nervous/anxious and has insomnia.   All other systems reviewed and are negative.    Medications: Current Outpatient Medications  Medication Sig Dispense Refill  . clonazePAM (KLONOPIN) 0.5 MG tablet Take 1 tablet (0.5 mg total) by mouth every 12 (twelve) hours as needed for anxiety. 20 tablet 0  . EPINEPHrine 0.3 mg/0.3 mL IJ SOAJ injection INJECT 0.3 ML INTO THE MUSCLE ONCE FOR 1 DOSE  1  . hydrOXYzine (ATARAX/VISTARIL) 25 MG tablet Take 1-2 tablets (25-50 mg total) by mouth 3 (three) times daily as needed for itching. 30 tablet 1  . pantoprazole (PROTONIX) 40 MG tablet Take by mouth.    . sucralfate (  CARAFATE) 1 g tablet Take 1 tablet (1 g total) by mouth 4 (four) times daily. 120 tablet 0   No current facility-administered medications for this visit.    Allergies  Allergen Reactions  . Pineapple Swelling    Throat and tongue swelling  . Strawberry Extract Swelling    Throat and tongue swelling  . Gabapentin Rash       Objective:  BP 110/75   Pulse (!) 110   Temp 98.4 F (36.9 C) (Oral)   Wt 150 lb (68 kg)   SpO2 98%   BMI 25.75 kg/m  Gen:  alert, not ill-appearing, no distress, appropriate for age HEENT: head normocephalic without obvious abnormality, conjunctiva and cornea clear, trachea midline Pulm: Normal work of breathing, normal phonation, clear to auscultation bilaterally, no wheezes, rales or rhonchi CV: tachycardic rate, regular rhythm, s1 and s2 distinct, no murmurs,  clicks or rubs  GI: Abdomen soft, nontender, nondistended, no guarding or rigidity Neuro: alert and oriented x 3, no tremor MSK: extremities atraumatic, normal gait and station Skin: intact, no rashes on exposed skin, no jaundice, no cyanosis Psych: appearance casual, cooperative, good eye contact, depressed mood, flat affect, speech is articulate, and thought processes clear and goal-directed  Lab Results  Component Value Date   CREATININE 0.84 01/23/2018   BUN 10 01/23/2018   NA 139 01/23/2018   K 3.7 (L) 01/23/2018   CL 106 01/23/2018   CO2 26 01/23/2018   Lab Results  Component Value Date   ALT 10 01/23/2018   AST 12 01/23/2018   ALKPHOS 80 10/18/2016   BILITOT 0.4 01/23/2018   Lab Results  Component Value Date   WBC 6.7 01/23/2018   HGB 12.1 01/23/2018   HCT 36.6 01/23/2018   MCV 84.7 01/23/2018   PLT 279 01/23/2018     No results found for this or any previous visit (from the past 72 hour(s)). No results found.    Assessment and Plan: 17 y.o. female with   .Diagnoses and all orders for this visit:  Generalized abdominal pain -     dicyclomine (BENTYL) 20 MG tablet; Take 1 tablet (20 mg total) by mouth 4 (four) times daily as needed for spasms.  GAD (generalized anxiety disorder) -     escitalopram (LEXAPRO) 5 MG tablet; Take 3 tablets (15 mg total) by mouth daily for 3 days, THEN 2 tablets (10 mg total) daily for 7 days, THEN 1 tablet (5 mg total) daily for 7 days. -     DULoxetine (CYMBALTA) 20 MG capsule; Take 1 capsule (20 mg total) by mouth at bedtime for 7 days, THEN 2 capsules (40 mg total) at bedtime for 23 days.  Tachycardia     Major depressive disorder, generalized anxiety disorder PHQ 9 equals 12, no acute safety issues Gad 7 equals 21, severe Has failed greater than 6 weeks of Lexapro Stopping imipramine due to adverse effects Switching to duloxetine Continue clonazepam every 12 hours as needed for panic attacks and severe  anxiety Patient counseled on cross-taper  Generalized abdominal pain Likely sequelae of uncontrolled anxiety, possibly side effect from Lexapro Benign abdominal exam Recent labs including CBC, liver function are unremarkable Starting dicyclomine 20 mg up to 4 times daily as needed Counseled on IBS diet  Tachycardia Pulse Readings from Last 3 Encounters:  03/05/18 (!) 110  02/20/18 (!) 111  01/30/18 78  Patient reports she has not had any water today and admits to drinking up to 3 cups of green  tea per day Instructed to hydrate with at least 64 ounces of water daily Limit green tea to 1 cup/day and avoid other caffeine/stimulants Follow-up in 1 month  Patient education and anticipatory guidance given Patient agrees with treatment plan Follow-up in 1 month or sooner as needed if symptoms worsen or fail to improve  Levonne Hubert PA-C

## 2018-03-05 NOTE — Patient Instructions (Addendum)
For increased heart rate: - increase water intake. Try to get 64 ounces per day - limit caffeine (no more than 1 green tea per day)  For anxiety: - stop Imipramine - take Hydroxyzine 1-2 tabs at bedtime as needed for insomnia/anxiety - follow instructions for Lexapro taper - at the same time start Duloxetine. Take Lexapro at night and Duloxetine in the morning  For abdominal discomfort - avoid gas-producing foods (pretzels, bagels, beans). See dietary recommendations below - start Dicyclomine 1 capsule up to 4 times per day as needed for pain  Counseling: - psychologytoday.com: search engine to locate local counselors - Family Services in your county offer counseling on a sliding scale (pay what you can afford) - [facility]: we can place a referral for you to see one of licensed counselors in [place], [place], or [place] - online counseling: English as a second language teacherBetterHelp and [employer] (not covered by insurance, but affordable self-pay rates)  Other resources: Best Overall for Online Counseling: [facility] "Talkspace's unlimited messaging and video conferencing makes it a convenient app for addressing a variety of needs."  Best Live Chat Sessions: Betterhelp "Betterhelp allows a variety of ways to contact your therapist, including live chat sessions."  Best for Couples: [facility] "[facility] specializes in couples therapy and they offer a variety of services to help address relationship issues."  Best for a Quick Consultation: [facility] "[facility] offers access to a variety of mental health professionals any time of day or night."  Best for Peer Support: 7 Cups of Tea "You can access free support from peers on 7 Cups of Tea or you can choose to pay to speak to a professional."  Best for Teens: Teen [facility]ing "Teens can chat, message, speak over the phone, or video conference with a therapist who has experience treating their age group."  Best for LGBTQ: [facility] "[facility] offers online therapy to individuals in the [place] community, and their goal is to offer discreet, affordable, and accessible treatment."  Best Free Assessment: Doctor on Demand "Doctor on Demand provides access to a free assessment that can help you determine if you should talk to someone about anxiety or depression."  Best for Access to a Psychiatrist: [facility] "[facility] provides access to psychiatrists who can prescribe and manage psychiatric medications."  Safety Plan: if having self-harm or suicidal thoughts Our Office [phone] [facility] Crisis Hotline (310) 196-2099519 159 1853 National Suicide Hotline 1-800-SUICIDE If in immediate danger of harming yourself, go to the nearest emergency room or call 911    Diet for Irritable Bowel Syndrome When you have irritable bowel syndrome (IBS), it is very important to eat the foods and follow the eating habits that are best for your condition. IBS may cause various symptoms such as pain in the abdomen, constipation, or diarrhea. Choosing the right foods can help to ease the discomfort from these symptoms. Work with your health care provider and diet and nutrition specialist (dietitian) to find the eating plan that will help to control your symptoms. What are tips for following this plan?      Keep a food diary. This will help you identify foods that cause symptoms. Write down: ? What you eat and when you eat it. ? What symptoms you have. ? When symptoms occur in relation to your meals, such as "pain in abdomen 2 hours after dinner."  Eat your meals slowly and in a relaxed setting.  Aim to eat 5-6 small meals per day. Do not skip meals.  Drink enough fluid to keep your  urine pale yellow.  Ask your health care provider if you should take an over-the-counter probiotic to help restore healthy bacteria in your gut (digestive tract). ? Probiotics are foods that contain good bacteria and yeasts.  Your dietitian may have specific  dietary recommendations for you based on your symptoms. He or she may recommend that you: ? Avoid foods that cause symptoms. Talk with your dietitian about other ways to get the same nutrients that are in those problem foods. ? Avoid foods with gluten. Gluten is a protein that is found in rye, wheat, and barley. ? Eat more foods that contain soluble fiber. Examples of foods with high soluble fiber include oats, seeds, and certain fruits and vegetables. Take a fiber supplement if directed by your dietitian. ? Reduce or avoid certain foods called FODMAPs. These are foods that contain carbohydrates that are hard to digest. Ask your doctor which foods contain these carbohydrates. What foods are not recommended? The following are some foods and drinks that may make your symptoms worse:  Fatty foods, such as french fries.  Foods that contain gluten, such as pasta and cereal.  Dairy products, such as milk, cheese, and ice cream.  Chocolate.  Alcohol.  Products with caffeine, such as coffee.  Carbonated drinks, such as soda.  Foods that are high in FODMAPs. These include certain fruits and vegetables.  Products with sweeteners such as honey, high fructose corn syrup, sorbitol, and mannitol. The items listed above may not be a complete list of foods and beverages you should avoid. Contact a dietitian for more information. What foods are good sources of fiber? Your health care provider or dietitian may recommend that you eat more foods that contain fiber. Fiber can help to reduce constipation and other IBS symptoms. Add foods with fiber to your diet a little at a time so your body can get used to them. Too much fiber at one time might cause gas and swelling of your abdomen. The following are some foods that are good sources of fiber:  Berries, such as raspberries, strawberries, and blueberries.  Tomatoes.  Carrots.  Brown rice.  Oats.  Seeds, such as chia and pumpkin seeds. The items  listed above may not be a complete list of recommended sources of fiber. Contact your dietitian for more options. Where to find more information  International Foundation for Functional Gastrointestinal Disorders: www.iffgd.[employer]  National Institute of Diabetes and Digestive and Kidney Diseases: CarFlippers.tnwww.niddk.nih.gov Summary  When you have irritable bowel syndrome (IBS), it is very important to eat the foods and follow the eating habits that are best for your condition.  IBS may cause various symptoms such as pain in the abdomen, constipation, or diarrhea.  Choosing the right foods can help to ease the discomfort that comes from symptoms.  Keep a food diary. This will help you identify foods that cause symptoms.  Your health care provider or diet and nutrition specialist (dietitian) may recommend that you eat more foods that contain fiber. This information is not intended to replace advice given to you by your health care provider. Make sure you discuss any questions you have with your health care provider. Document Released: 05/14/2003 Document Revised: 09/18/2017 Document Reviewed: 10/25/2016 Elsevier Interactive Patient Education  2019 [employer]

## 2018-03-07 ENCOUNTER — Encounter: Payer: Self-pay | Admitting: Physician Assistant

## 2018-03-08 ENCOUNTER — Encounter: Payer: Self-pay | Admitting: Physician Assistant

## 2018-03-08 ENCOUNTER — Ambulatory Visit (INDEPENDENT_AMBULATORY_CARE_PROVIDER_SITE_OTHER): Payer: BLUE CROSS/BLUE SHIELD | Admitting: Physician Assistant

## 2018-03-08 VITALS — BP 121/79 | HR 117 | Temp 98.4°F | Wt 150.0 lb

## 2018-03-08 DIAGNOSIS — R002 Palpitations: Secondary | ICD-10-CM

## 2018-03-08 DIAGNOSIS — R000 Tachycardia, unspecified: Secondary | ICD-10-CM

## 2018-03-08 DIAGNOSIS — R5381 Other malaise: Secondary | ICD-10-CM | POA: Diagnosis not present

## 2018-03-08 DIAGNOSIS — R7989 Other specified abnormal findings of blood chemistry: Secondary | ICD-10-CM | POA: Diagnosis not present

## 2018-03-08 NOTE — Progress Notes (Signed)
HPI:                                                                Christina Munoz is a 18 y.o. female who presents to [name] today for tachycardia  Patient reports sudden onset tachycardia with resting heart rate of 152 bpm last night. Associated with diaphoresis. States "felt like my heart was beating out of my chest." She was relaxed watching tv at the time her symptoms started. Her apple watch recorded her pulse. States this episode lasted 30 minutes to 1 hour. It resolved after taking Clonazepam. States "Ive had racing heart for years."  Reports hx of chest pain several years ago, but has not had any chest pain recently. No dizziness or syncope.  Denies tobacco use/vaping. Denies drug use. Denies any caffeine consumption. Currently being treated for generalized anxiety disorder.  She was evaluated by pediatric cardiology in 2017 for chest pain and palpitations. ECG and physical exam were performed and she was discharged from cardiology.   Past Medical History:  Diagnosis Date  . History of acute pancreatitis   . History of gallstones   . Hx of migraine headaches   . Lumbar degenerative disc disease 01/23/2018  . PUD (peptic ulcer disease)    Past Surgical History:  Procedure Laterality Date  . CHOLECYSTECTOMY    . ESOPHAGOGASTRODUODENOSCOPY    . WISDOM TOOTH EXTRACTION     Social History   Tobacco Use  . Smoking status: Never Smoker  . Smokeless tobacco: Never Used  Substance Use Topics  . Alcohol use: No   family history includes Breast cancer in her paternal grandmother; Depression in her maternal grandmother and mother; Heart disease in her maternal grandmother.    ROS: negative except as noted in the HPI  Medications: Current Outpatient Medications  Medication Sig Dispense Refill  . clonazePAM (KLONOPIN) 0.5 MG tablet Take 1 tablet (0.5 mg total) by mouth every 12 (twelve) hours as needed for anxiety. 20 tablet  0  . dicyclomine (BENTYL) 20 MG tablet Take 1 tablet (20 mg total) by mouth 4 (four) times daily as needed for spasms. 120 tablet 0  . DULoxetine (CYMBALTA) 20 MG capsule Take 1 capsule (20 mg total) by mouth at bedtime for 7 days, THEN 2 capsules (40 mg total) at bedtime for 23 days. 60 capsule 0  . EPINEPHrine 0.3 mg/0.3 mL IJ SOAJ injection INJECT 0.3 ML INTO THE MUSCLE ONCE FOR 1 DOSE  1  . escitalopram (LEXAPRO) 5 MG tablet Take 3 tablets (15 mg total) by mouth daily for 3 days, THEN 2 tablets (10 mg total) daily for 7 days, THEN 1 tablet (5 mg total) daily for 7 days. 30 tablet 0  . hydrOXYzine (ATARAX/VISTARIL) 25 MG tablet Take 1-2 tablets (25-50 mg total) by mouth 3 (three) times daily as needed for itching. 30 tablet 1  . pantoprazole (PROTONIX) 40 MG tablet Take by mouth.    . sucralfate (CARAFATE) 1 g tablet Take 1 tablet (1 g total) by mouth 4 (four) times daily. 120 tablet 0   No current facility-administered medications for this visit.    Allergies  Allergen Reactions  . Pineapple Swelling    Throat and tongue swelling  . Strawberry Extract  Swelling    Throat and tongue swelling  . Gabapentin Rash  . Imipramine Other (See Comments)    Sweating       Objective:  BP 121/79   Pulse (!) 117   Temp 98.4 F (36.9 C) (Oral)   Wt 150 lb (68 kg)   SpO2 100%   BMI 25.75 kg/m  Gen:  alert, not ill-appearing, no distress, appropriate for age HEENT: head normocephalic without obvious abnormality, conjunctiva and cornea clear, trachea midline Pulm: Normal work of breathing, normal phonation, clear to auscultation bilaterally, no wheezes, rales or rhonchi CV: tachycardic rate, regular rhythm, s1 and s2 distinct, no murmurs, clicks or rubs  Neuro: alert and oriented x 3, no tremor MSK: extremities atraumatic, normal gait and station Skin: intact, no rashes on exposed skin, no jaundice, no cyanosis Psych: well-groomed, cooperative, good eye contact, speech is articulate, and  thought processes clear and goal-directed  Pulse Readings from Last 3 Encounters:  03/08/18 (!) 117  03/05/18 (!) 110  02/20/18 (!) 111     ECG 03/08/2018 14:46 Vent rate 110 bpm PR-I 118 ms QRS 90 ms QT/QTc 336/454 Sinus tachycardia  No results found for this or any previous visit (from the past 72 hour(s)). No results found.    Assessment and Plan: 18 y.o. female with   .Emra was seen today for tachycardia.  Diagnoses and all orders for this visit:  Sinus tachycardia by electrocardiogram -     D-dimer, quantitative (not at [facility]) -     CBC with Differential/Platelet -     COMPLETE METABOLIC PANEL WITH GFR -     Thyroid Panel With TSH -     Holter monitor - 48 hour; Future -     EKG 12-Lead  Palpitations -     Holter monitor - 48 hour; Future -     EKG 12-Lead    ECG personally reviewed by me and Dr. Benjamin Stain Sinus tachycardia at 110, no ST or T wave abnormalities, no dysrhythmia Tachycardia work-up to include d-dimer, low clinical suspicion for PE given absence of chest pain and dyspnea and pulse ox of 100% on room air Work-up to also include CBC to rule out anemia, CMP to rule out electrolyte disturbance, thyroid panel 48-hour Holter monitor also ordered  Counseled on general measures for tachycardia ED return precautions discussed with patient I encouraged her to go to the nearest emergency room if she has an additional episode of heart rate above 120 at rest   Patient education and anticipatory guidance given Patient agrees with treatment plan Follow-up in 3 weeks or sooner as needed if symptoms worsen or fail to improve  Levonne Hubert PA-C

## 2018-03-08 NOTE — Patient Instructions (Signed)
Sinus Tachycardia  Sinus tachycardia is a kind of fast heartbeat. In sinus tachycardia, the heart beats more than 100 times a minute. Sinus tachycardia starts in a part of the heart called the sinus node. Sinus tachycardia may be harmless, or it may be a sign of a serious condition. What are the causes? This condition may be caused by:  Exercise or exertion.  A fever.  Pain.  Loss of body fluids (dehydration).  Severe bleeding (hemorrhage).  Anxiety and stress.  Certain substances, including: ? Alcohol. ? Caffeine. ? Tobacco and nicotine products. ? Cold medicines. ? Illegal drugs.  Medical conditions including: ? Heart disease. ? An infection. ? An overactive thyroid (hyperthyroidism). ? A lack of red blood cells (anemia). What are the signs or symptoms? Symptoms of this condition include:  A feeling that the heart is beating quickly (palpitations).  Suddenly noticing your heartbeat (cardiac awareness).  Dizziness.  Tiredness (fatigue).  Shortness of breath.  Chest pain.  Nausea.  Fainting. How is this diagnosed? This condition is diagnosed with:  A physical exam.  Other tests, such as: ? Blood tests. ? An electrocardiogram (ECG). This test measures the electrical activity of the heart. ? Ambulatory cardiac monitor. This records your heartbeats for 24 hours or more. You may be referred to a heart specialist (cardiologist). How is this treated? Treatment for this condition depends on the cause or the underlying condition. Treatment may involve:  Treating the underlying condition.  Taking new medicines or changing your current medicines as told by your health care provider.  Making changes to your diet or lifestyle. Follow these instructions at home: Lifestyle   Do not use any products that contain nicotine or tobacco, such as cigarettes and e-cigarettes. If you need help quitting, ask your health care provider.  Do not use illegal drugs, such as  cocaine.  Learn relaxation methods to help you when you get stressed or anxious. These include deep breathing.  Avoid caffeine or other stimulants. Alcohol use   Do not drink alcohol if: ? Your health care provider tells you not to drink. ? You are pregnant, may be pregnant, or are planning to become pregnant.  If you drink alcohol, limit how much you have: ? 0-1 drink a day for women. ? 0-2 drinks a day for men.  Be aware of how much alcohol is in your drink. In the [place] one drink equals one typical bottle of beer (12 oz), one-half glass of wine (5 oz), or one shot of hard liquor (1 oz). General instructions  Drink enough fluids to keep your urine pale yellow.  Take over-the-counter and prescription medicines only as told by your health care provider.  Keep all follow-up visits as told by your health care provider. This is important. Contact a health care provider if you have:  A fever.  Vomiting or diarrhea that does not go away. Get help right away if you:  Have pain in your chest, upper arms, jaw, or neck.  Become weak or dizzy.  Feel faint.  Have palpitations that do not go away. Summary  In sinus tachycardia, the heart beats more than 100 times a minute.  Sinus tachycardia may be harmless, or it may be a sign of a serious condition.  Treatment for this condition depends on the cause or the underlying condition.  Get help right away if you have pain in your chest, upper arms, jaw, or neck. This information is not intended to replace advice given to you by   your health care provider. Make sure you discuss any questions you have with your health care provider. Document Released: 03/31/2004 Document Revised: 04/12/2017 Document Reviewed: 04/12/2017 Elsevier Interactive Patient Education  2019 Elsevier Inc.  

## 2018-03-09 ENCOUNTER — Encounter: Payer: Self-pay | Admitting: Physician Assistant

## 2018-03-09 ENCOUNTER — Other Ambulatory Visit: Payer: Self-pay | Admitting: Physician Assistant

## 2018-03-09 DIAGNOSIS — R000 Tachycardia, unspecified: Secondary | ICD-10-CM | POA: Insufficient documentation

## 2018-03-09 DIAGNOSIS — F418 Other specified anxiety disorders: Secondary | ICD-10-CM

## 2018-03-09 DIAGNOSIS — R002 Palpitations: Secondary | ICD-10-CM | POA: Insufficient documentation

## 2018-03-09 LAB — COMPLETE METABOLIC PANEL WITH GFR
AG Ratio: 2 (calc) (ref 1.0–2.5)
ALKALINE PHOSPHATASE (APISO): 56 U/L (ref 47–176)
ALT: 51 U/L — ABNORMAL HIGH (ref 5–32)
AST: 18 U/L (ref 12–32)
Albumin: 4.3 g/dL (ref 3.6–5.1)
BUN: 12 mg/dL (ref 7–20)
CHLORIDE: 103 mmol/L (ref 98–110)
CO2: 28 mmol/L (ref 20–32)
CREATININE: 0.82 mg/dL (ref 0.50–1.00)
Calcium: 9.1 mg/dL (ref 8.9–10.4)
GFR, Est African American: 120 mL/min/{1.73_m2} (ref >=60)
GFR, Est Non African American: 104 mL/min/{1.73_m2} (ref >=60)
GLUCOSE: 72 mg/dL (ref 65–99)
Globulin: 2.2 g/dL (calc) (ref 2.0–3.8)
Potassium: 4.4 mmol/L (ref 3.8–5.1)
SODIUM: 138 mmol/L (ref 135–146)
Total Bilirubin: 0.6 mg/dL (ref 0.2–1.1)
Total Protein: 6.5 g/dL (ref 6.3–8.2)

## 2018-03-09 LAB — CBC WITH DIFFERENTIAL/PLATELET
Absolute Monocytes: 978 cells/uL — ABNORMAL HIGH (ref 200–950)
BASOS PCT: 0.3 %
Basophils Absolute: 35 cells/uL (ref 0–200)
EOS ABS: 230 {cells}/uL (ref 15–500)
Eosinophils Relative: 2 %
HCT: 38.3 % (ref 35.0–45.0)
Hemoglobin: 12.9 g/dL (ref 11.7–15.5)
Lymphs Abs: 2898 cells/uL (ref 850–3900)
MCH: 28.5 pg (ref 27.0–33.0)
MCHC: 33.7 g/dL (ref 32.0–36.0)
MCV: 84.7 fL (ref 80.0–100.0)
MONOS PCT: 8.5 %
MPV: 10.5 fL (ref 7.5–12.5)
NEUTROS PCT: 64 %
Neutro Abs: 7360 cells/uL (ref 1500–7800)
PLATELETS: 262 10*3/uL (ref 140–400)
RBC: 4.52 10*6/uL (ref 3.80–5.10)
RDW: 12.6 % (ref 11.0–15.0)
TOTAL LYMPHOCYTE: 25.2 %
WBC: 11.5 10*3/uL — AB (ref 3.8–10.8)

## 2018-03-09 LAB — THYROID PANEL WITH TSH
Free Thyroxine Index: 1.9 (ref 1.4–3.8)
T3 UPTAKE: 29 % (ref 22–35)
T4 TOTAL: 6.6 ug/dL (ref 5.3–11.7)
TSH: 0.92 mIU/L

## 2018-03-09 LAB — D-DIMER, QUANTITATIVE: D-Dimer, Quant: 0.19 mcg/mL FEU (ref <0.50)

## 2018-03-09 MED ORDER — CLONAZEPAM 0.5 MG PO TABS: 0.5000 mg | ORAL_TABLET | Freq: Two times a day (BID) | ORAL | 0 refills | Status: DC | PRN

## 2018-03-11 ENCOUNTER — Encounter: Payer: Self-pay | Admitting: Physician Assistant

## 2018-03-12 ENCOUNTER — Encounter: Payer: Self-pay | Admitting: Physician Assistant

## 2018-03-13 ENCOUNTER — Ambulatory Visit (INDEPENDENT_AMBULATORY_CARE_PROVIDER_SITE_OTHER): Payer: BLUE CROSS/BLUE SHIELD

## 2018-03-13 DIAGNOSIS — R002 Palpitations: Secondary | ICD-10-CM

## 2018-03-13 DIAGNOSIS — R000 Tachycardia, unspecified: Secondary | ICD-10-CM

## 2018-03-14 ENCOUNTER — Emergency Department (INDEPENDENT_AMBULATORY_CARE_PROVIDER_SITE_OTHER)
Admission: EM | Admit: 2018-03-14 | Discharge: 2018-03-14 | Disposition: A | Discharge status: Home / Self Care | Payer: BLUE CROSS/BLUE SHIELD | Source: Home / Self Care | Attending: Emergency Medicine | Admitting: Emergency Medicine

## 2018-03-14 ENCOUNTER — Encounter: Payer: Self-pay | Admitting: *Deleted

## 2018-03-14 ENCOUNTER — Other Ambulatory Visit: Payer: Self-pay

## 2018-03-14 DIAGNOSIS — J0300 Acute streptococcal tonsillitis, unspecified: Secondary | ICD-10-CM

## 2018-03-14 LAB — POCT RAPID STREP A (OFFICE): Rapid Strep A Screen: POSITIVE — AB

## 2018-03-14 MED ORDER — PENICILLIN G BENZATHINE 1200000 UNIT/2ML IM SUSP
1.2000 10*6.[IU] | Freq: Once | INTRAMUSCULAR | Status: AC
  Administered 2018-03-14: 1.2 10*6.[IU] via INTRAMUSCULAR

## 2018-03-14 NOTE — Discharge Instructions (Addendum)
Your quick strep test is positive. Treatment today is 1 shot of long-acting penicillin. You need to rest at home and drink plenty of fluids.  Tylenol or ibuprofen if needed for pain or fever. Please see attached instruction sheets on strep throat and tonsillitis. Note printed, excuse from work for next 3 days but may return to work Saturday if feeling better and have no fever. PCP if not improving in 5 to 7 days, seek medical care sooner if worse or new symptoms

## 2018-03-14 NOTE — ED Triage Notes (Signed)
Pt c/o sore throat and red spots on her throat x 3 days. She was dx with mono 01/23/2018.

## 2018-03-14 NOTE — ED Provider Notes (Signed)
[facility]    CSN: [id number] Arrival date & time: 03/14/18  1124     History   Chief Complaint Chief Complaint  Patient presents with  . Sore Throat    HPI Christina Munoz is a 18 y.o. female.   HPI 3 days of progressively worsening sore throat.  Sore throat is severe. Associated symptoms: Positive fever and chills.  Has decreased appetite.  Minimal nausea at times.  But no vomiting.  No abdominal pain.  Denies pelvic pain. No swollen neck glands.  Minimal bilateral ear pressure.  No nasal congestion or any significant cough.  Of note, she has had prior diagnosis of mononucleosis with a positive Epstein-Barr test at her PCP November 2019. Past Medical History:  Diagnosis Date  . History of acute pancreatitis   . History of gallstones   . Hx of migraine headaches   . Lumbar degenerative disc disease 01/23/2018  . PUD (peptic ulcer disease)     Patient Active Problem List   Diagnosis Date Noted  . Sinus tachycardia by electrocardiogram 03/09/2018  . Palpitations 03/09/2018  . Generalized abdominal pain 03/05/2018  . GAD (generalized anxiety disorder) 03/05/2018  . Tachycardia 03/05/2018  . Pelvic pain 01/30/2018  . Dyspareunia in female 01/30/2018  . Constipation 01/23/2018  . Lumbar degenerative disc disease 01/23/2018  . EBV infection 01/23/2018  . Renal cyst, right 01/19/2018  . Bilirubinuria 01/18/2018  . Left axillary pain 01/11/2018  . Encounter for counseling regarding contraception 01/11/2018  . History of chlamydia infection 01/10/2018  . History of migraine with aura 01/03/2018  . Current severe episode of major depressive disorder without psychotic features (HCC) 01/03/2018  . Anxiety with depression 01/03/2018  . Suicidal ideations 01/03/2018  . Nicotine dependence 01/03/2018  . History of acute pancreatitis     Past Surgical History:  Procedure Laterality Date  . CHOLECYSTECTOMY    . ESOPHAGOGASTRODUODENOSCOPY    . WISDOM TOOTH  EXTRACTION      OB History    Gravida  0   Para  0   Term  0   Preterm  0   AB  0   Living  0     SAB  0   TAB  0   Ectopic  0   Multiple  0   Live Births  0            Home Medications    Prior to Admission medications   Medication Sig Start Date End Date Taking? Authorizing Provider  clonazePAM (KLONOPIN) 0.5 MG tablet Take 1 tablet (0.5 mg total) by mouth every 12 (twelve) hours as needed for anxiety. 03/09/18   Carlis Stableummings, Charley Elizabeth, PA-C  dicyclomine (BENTYL) 20 MG tablet Take 1 tablet (20 mg total) by mouth 4 (four) times daily as needed for spasms. 03/05/18   Carlis Stableummings, Charley Elizabeth, PA-C  DULoxetine (CYMBALTA) 20 MG capsule Take 1 capsule (20 mg total) by mouth at bedtime for 7 days, THEN 2 capsules (40 mg total) at bedtime for 23 days. 03/05/18 04/04/18  Carlis Stableummings, Charley Elizabeth, PA-C  EPINEPHrine 0.3 mg/0.3 mL IJ SOAJ injection INJECT 0.3 ML INTO THE MUSCLE ONCE FOR 1 DOSE 06/30/17   [provider]  escitalopram (LEXAPRO) 5 MG tablet Take 3 tablets (15 mg total) by mouth daily for 3 days, THEN 2 tablets (10 mg total) daily for 7 days, THEN 1 tablet (5 mg total) daily for 7 days. 03/05/18 03/22/18  Carlis Stableummings, Charley Elizabeth, PA-C  hydrOXYzine (ATARAX/VISTARIL) 25 MG tablet  Take 1-2 tablets (25-50 mg total) by mouth 3 (three) times daily as needed for itching. 02/20/18   Sunnie Nielsen, DO  pantoprazole (PROTONIX) 40 MG tablet Take by mouth. 10/12/17   [provider]  sucralfate (CARAFATE) 1 g tablet Take 1 tablet (1 g total) by mouth 4 (four) times daily. 01/23/18   Carlis Stable, PA-C    Family History Family History  Problem Relation Age of Onset  . Breast cancer Paternal Grandmother   . Heart disease Maternal Grandmother   . Depression Maternal Grandmother   . Depression Mother     Social History Social History   Tobacco Use  . Smoking status: Never Smoker  . Smokeless tobacco: Never Used    Substance Use Topics  . Alcohol use: No  . Drug use: Yes    Types: Marijuana   Reviewed above social history  Allergies   Pineapple; Strawberry extract; Gabapentin; and Imipramine Reviewed allergies again with patient.  She denies allergies to penicillin or amoxicillin.  Review of Systems Review of Systems Pertinent items noted in HPI and remainder of comprehensive ROS otherwise negative.   Physical Exam Triage Vital Signs ED Triage Vitals [03/14/18 1146]  Enc Vitals Group     BP 119/81     Pulse Rate 98     Resp 16     Temp 98.5 F (36.9 C)     Temp Source Oral     SpO2 100 %     Weight 151 lb (68.5 kg)     Height 5\' 4"  (1.626 m)     Head Circumference      Peak Flow      Pain Score 0     Pain Loc      Pain Edu?      Excl. in GC?    No data found.  Updated Vital Signs BP 119/81 (BP Location: Right Arm)   Pulse 98   Temp 98.5 F (36.9 C) (Oral)   Resp 16   Ht 5\' 4"  (1.626 m)   Wt 68.5 kg   LMP 02/22/2018   SpO2 100%   BMI 25.92 kg/m      Physical Exam Vitals signs and nursing note reviewed.  Constitutional:      General: She is not in acute distress.    Appearance: She is well-developed. She is ill-appearing (She appears acutely ill fatigued.  She is alert and cooperative.). She is not toxic-appearing.  HENT:     Head: Normocephalic and atraumatic.     Right Ear: Tympanic membrane, ear canal and external ear normal. No drainage.     Left Ear: Tympanic membrane, ear canal and external ear normal. No drainage.     Nose: Nose normal. No rhinorrhea.     Right Sinus: No maxillary sinus tenderness or frontal sinus tenderness.     Left Sinus: No maxillary sinus tenderness or frontal sinus tenderness.     Mouth/Throat:     Mouth: Mucous membranes are moist. No oral lesions.     Pharynx: Uvula midline. Posterior oropharyngeal erythema present. No oropharyngeal exudate or uvula swelling.     Tonsils: No tonsillar abscesses.     Comments: 1+ beefy red  tonsils with slight whitish exudate bilaterally.  Airway intact. Oral mucous membranes mostly moist, lips somewhat dry without lesions Eyes:     General: No scleral icterus.    Conjunctiva/sclera: Conjunctivae normal.  Neck:     Musculoskeletal: Neck supple.     Comments: Tender shotty anterior  cervical nodes.  No posterior cervical adenopathy Cardiovascular:     Rate and Rhythm: Normal rate and regular rhythm.     Heart sounds: Normal heart sounds. No murmur. No friction rub. No gallop.   Pulmonary:     Effort: Pulmonary effort is normal. No respiratory distress.     Breath sounds: Normal breath sounds. No stridor. No wheezing, rhonchi or rales.  Abdominal:     Palpations: Abdomen is soft. There is no mass.     Tenderness: There is no abdominal tenderness.  Lymphadenopathy:     Cervical: Cervical adenopathy present.     Right cervical: Superficial cervical adenopathy present. No deep or posterior cervical adenopathy.    Left cervical: Superficial cervical adenopathy present. No deep or posterior cervical adenopathy.  Skin:    General: Skin is warm.     Findings: No rash.  Neurological:     Mental Status: She is alert and oriented to person, place, and time.     Cranial Nerves: Cranial nerves are intact.      UC Treatments / Results  Labs (all labs ordered are listed, but only abnormal results are displayed) Labs Reviewed  POCT RAPID STREP A (OFFICE) - Abnormal; Notable for the following components:      Result Value   Rapid Strep A Screen Positive (*)    All other components within normal limits    EKG None  Radiology No results found.  Procedures Procedures (including critical care time)  Medications Ordered in UC Medications  penicillin g benzathine (BICILLIN LA) 1200000 UNIT/2ML injection 1.2 Million Units (1.2 Million Units Intramuscular Given 03/14/18 1213)    Initial Impression / Assessment and Plan / UC Course  I have reviewed the triage vital signs and  the nursing notes.  Pertinent labs & imaging results that were available during my care of the patient were reviewed by me and considered in my medical decision making (see chart for details).    Rapid strep test positive.  Treatment options discussed at length.  Risk benefits alternatives discussed. She prefers antibiotic shot to treat her strep tonsillitis. Bicillin LA 1,200,000 units given IM.  Patient observed, no reactions  Final Clinical Impressions(s) / UC Diagnoses   Final diagnoses:  Strep tonsillitis     Discharge Instructions     Your quick strep test is positive. Treatment today is 1 shot of long-acting penicillin. You need to rest at home and drink plenty of fluids.  Tylenol or ibuprofen if needed for pain or fever. Please see attached instruction sheets on strep throat and tonsillitis. Note printed, excuse from work for next 3 days but may return to work Saturday if feeling better and have no fever. PCP if not improving in 5 to 7 days, seek medical care sooner if worse or new symptoms    ED Prescriptions    None     She declined any antinausea prescription   Lajean Manes, MD 03/14/18 1221

## 2018-03-17 ENCOUNTER — Other Ambulatory Visit: Payer: Self-pay

## 2018-03-17 ENCOUNTER — Encounter: Payer: Self-pay | Admitting: Emergency Medicine

## 2018-03-17 ENCOUNTER — Emergency Department (INDEPENDENT_AMBULATORY_CARE_PROVIDER_SITE_OTHER)
Admission: EM | Admit: 2018-03-17 | Discharge: 2018-03-17 | Disposition: A | Discharge status: Home / Self Care | Payer: BLUE CROSS/BLUE SHIELD | Source: Home / Self Care | Attending: Family Medicine | Admitting: Family Medicine

## 2018-03-17 DIAGNOSIS — R059 Cough, unspecified: Secondary | ICD-10-CM

## 2018-03-17 DIAGNOSIS — R05 Cough: Secondary | ICD-10-CM

## 2018-03-17 LAB — POCT INFLUENZA A/B
INFLUENZA B, POC: NEGATIVE
Influenza A, POC: NEGATIVE

## 2018-03-17 MED ORDER — BENZONATATE 100 MG PO CAPS: 100.0000 mg | ORAL_CAPSULE | Freq: Three times a day (TID) | ORAL | 0 refills | Status: DC

## 2018-03-17 NOTE — Discharge Instructions (Signed)
°  You may take the cough medication as prescribed. Be sure to get plenty of rest and stay well hydrated Please follow up with family medicine later this week as needed.

## 2018-03-17 NOTE — ED Provider Notes (Signed)
[facility]    CSN: [id number] Arrival date & time: 03/17/18  1059     History   Chief Complaint Chief Complaint  Patient presents with  . Generalized Body Aches    Flu like sx's  . Cough    HPI Christina Munoz is a 18 y.o. female.   HPI Christina Munoz is a 18 y.o. female presenting to UC with c/o worsening cough [facility].  She went to work yesterday but developed body aches and mild cough with clear sputum. Temp of 99*F.  Denies chest pain or SOB. Throat pain has improved significantly since last visit. Denies n/v/d.    Past Medical History:  Diagnosis Date  . History of acute pancreatitis   . History of gallstones   . Hx of migraine headaches   . Lumbar degenerative disc disease 01/23/2018  . PUD (peptic ulcer disease)     Patient Active Problem List   Diagnosis Date Noted  . Sinus tachycardia by electrocardiogram 03/09/2018  . Palpitations 03/09/2018  . Generalized abdominal pain 03/05/2018  . GAD (generalized anxiety disorder) 03/05/2018  . Tachycardia 03/05/2018  . Pelvic pain 01/30/2018  . Dyspareunia in female 01/30/2018  . Constipation 01/23/2018  . Lumbar degenerative disc disease 01/23/2018  . EBV infection 01/23/2018  . Renal cyst, right 01/19/2018  . Bilirubinuria 01/18/2018  . Left axillary pain 01/11/2018  . Encounter for counseling regarding contraception 01/11/2018  . History of chlamydia infection 01/10/2018  . History of migraine with aura 01/03/2018  . Current severe episode of major depressive disorder without psychotic features (HCC) 01/03/2018  . Anxiety with depression 01/03/2018  . Suicidal ideations 01/03/2018  . Nicotine dependence 01/03/2018  . History of acute pancreatitis     Past Surgical History:  Procedure Laterality Date  . CHOLECYSTECTOMY    . ESOPHAGOGASTRODUODENOSCOPY    . WISDOM TOOTH EXTRACTION      OB History    Gravida  0   Para  0   Term  0   Preterm  0   AB  0   Living  0     SAB  0   TAB  0   Ectopic  0   Multiple  0   Live Births  0            Home Medications    Prior to Admission medications   Medication Sig Start Date End Date Taking? Authorizing Provider  benzonatate (TESSALON) 100 MG capsule Take 1-2 capsules (100-200 mg total) by mouth every 8 (eight) hours. 03/17/18   Lurene ShadowPhelps, Allyce Bochicchio O, PA-C  clonazePAM (KLONOPIN) 0.5 MG tablet Take 1 tablet (0.5 mg total) by mouth every 12 (twelve) hours as needed for anxiety. 03/09/18   Carlis Stableummings, Charley Elizabeth, PA-C  dicyclomine (BENTYL) 20 MG tablet Take 1 tablet (20 mg total) by mouth 4 (four) times daily as needed for spasms. 03/05/18   Carlis Stableummings, Charley Elizabeth, PA-C  DULoxetine (CYMBALTA) 20 MG capsule Take 1 capsule (20 mg total) by mouth at bedtime for 7 days, THEN 2 capsules (40 mg total) at bedtime for 23 days. 03/05/18 04/04/18  Carlis Stableummings, Charley Elizabeth, PA-C  EPINEPHrine 0.3 mg/0.3 mL IJ SOAJ injection INJECT 0.3 ML INTO THE MUSCLE ONCE FOR 1 DOSE 06/30/17   [provider]  escitalopram (LEXAPRO) 5 MG tablet Take 3 tablets (15 mg total) by mouth daily for 3 days, THEN 2 tablets (10 mg total) daily for 7  days, THEN 1 tablet (5 mg total) daily for 7 days. 03/05/18 03/22/18  Carlis Stable, PA-C  hydrOXYzine (ATARAX/VISTARIL) 25 MG tablet Take 1-2 tablets (25-50 mg total) by mouth 3 (three) times daily as needed for itching. 02/20/18   Sunnie Nielsen, DO  pantoprazole (PROTONIX) 40 MG tablet Take by mouth. 10/12/17   [provider]  sucralfate (CARAFATE) 1 g tablet Take 1 tablet (1 g total) by mouth 4 (four) times daily. 01/23/18   Carlis Stable, PA-C    Family History Family History  Problem Relation Age of Onset  . Breast cancer Paternal Grandmother   . Heart disease Maternal Grandmother   . Depression Maternal Grandmother   . Depression Mother     Social History Social History   Tobacco Use  .  Smoking status: Never Smoker  . Smokeless tobacco: Never Used  Substance Use Topics  . Alcohol use: No  . Drug use: Yes    Types: Marijuana     Allergies   Pineapple; Strawberry extract; Gabapentin; and Imipramine   Review of Systems Review of Systems  Constitutional: Negative for chills and fever.  HENT: Positive for congestion and sore throat (scratchy). Negative for ear pain, trouble swallowing and voice change.   Respiratory: Positive for cough. Negative for shortness of breath.   Cardiovascular: Negative for chest pain and palpitations.  Gastrointestinal: Negative for abdominal pain, diarrhea, nausea and vomiting.  Musculoskeletal: Negative for arthralgias, back pain and myalgias.  Skin: Negative for rash.     Physical Exam Triage Vital Signs ED Triage Vitals [03/17/18 1133]  Enc Vitals Group     BP 105/73     Pulse Rate (!) 109     Resp      Temp 98.2 F (36.8 C)     Temp Source Oral     SpO2 99 %     Weight 150 lb (68 kg)     Height 5\' 4"  (1.626 m)     Head Circumference      Peak Flow      Pain Score 6     Pain Loc      Pain Edu?      Excl. in GC?    No data found.  Updated Vital Signs BP 105/73 (BP Location: Left Arm)   Pulse (!) 109   Temp 98.2 F (36.8 C) (Oral)   Ht 5\' 4"  (1.626 m)   Wt 150 lb (68 kg)   LMP 02/22/2018   SpO2 99%   BMI 25.75 kg/m   Visual Acuity Right Eye Distance:   Left Eye Distance:   Bilateral Distance:    Right Eye Near:   Left Eye Near:    Bilateral Near:     Physical Exam Vitals signs and nursing note reviewed.  Constitutional:      Appearance: Normal appearance. She is well-developed.  HENT:     Head: Normocephalic and atraumatic.     Right Ear: Tympanic membrane normal.     Left Ear: Tympanic membrane normal.     Nose: Nose normal.     Right Sinus: No maxillary sinus tenderness or frontal sinus tenderness.     Left Sinus: No maxillary sinus tenderness or frontal sinus tenderness.     Mouth/Throat:      Lips: Pink.     Mouth: Mucous membranes are moist.     Pharynx: Oropharynx is clear. Uvula midline. No pharyngeal swelling, oropharyngeal exudate, posterior oropharyngeal erythema or uvula swelling.  Neck:  Musculoskeletal: Normal range of motion and neck supple.  Cardiovascular:     Rate and Rhythm: Normal rate and regular rhythm.  Pulmonary:     Effort: Pulmonary effort is normal. No respiratory distress.     Breath sounds: Normal breath sounds. No stridor. No wheezing or rhonchi.  Musculoskeletal: Normal range of motion.  Skin:    General: Skin is warm and dry.  Neurological:     Mental Status: She is alert and oriented to person, place, and time.  Psychiatric:        Behavior: Behavior normal.      UC Treatments / Results  Labs (all labs ordered are listed, but only abnormal results are displayed) Labs Reviewed  POCT INFLUENZA A/B    EKG None  Radiology No results found.  Procedures Procedures (including critical care time)  Medications Ordered in UC Medications - No data to display  Initial Impression / Assessment and Plan / UC Course  I have reviewed the triage vital signs and the nursing notes.  Pertinent labs & imaging results that were available during my care of the patient were reviewed by me and considered in my medical decision making (see chart for details).     Rapid flu: NEGATIVE Reassured pt of negative flu and good lung sounds. Will tx cough with tessalon PCN stays in pt's system for over 10 years, will continue treating bacterial infection.  Final Clinical Impressions(s) / UC Diagnoses   Final diagnoses:  Cough     Discharge Instructions      You may take the cough medication as prescribed. Be sure to get plenty of rest and stay well hydrated Please follow up with family medicine later this week as needed.    ED Prescriptions    Medication Sig Dispense Auth. Provider   benzonatate (TESSALON) 100 MG capsule Take 1-2 capsules  (100-200 mg total) by mouth every 8 (eight) hours. 21 capsule Lurene ShadowPhelps, Layliana Devins O, PA-C     Controlled Substance Prescriptions [place] Controlled Substance Registry consulted? Not Applicable   Rolla Platehelps, Korey Prashad O, PA-C 03/17/18 1503

## 2018-03-17 NOTE — ED Triage Notes (Signed)
Pt returns today with worsening sx's after diagnosed with Strep x 3 days ago. Seen here and given injection. Went to work yesterday- developed body aches, cough, slight clear phlegm and fever.

## 2018-03-19 ENCOUNTER — Encounter: Payer: Self-pay | Admitting: Physician Assistant

## 2018-03-20 MED ORDER — METOPROLOL SUCCINATE ER 25 MG PO TB24: 25.0000 mg | ORAL_TABLET | Freq: Every day | ORAL | 2 refills | Status: DC

## 2018-03-27 ENCOUNTER — Other Ambulatory Visit: Payer: Self-pay | Admitting: Physician Assistant

## 2018-03-27 DIAGNOSIS — R1084 Generalized abdominal pain: Secondary | ICD-10-CM

## 2018-03-27 DIAGNOSIS — F411 Generalized anxiety disorder: Secondary | ICD-10-CM

## 2018-03-28 ENCOUNTER — Other Ambulatory Visit: Payer: Self-pay | Admitting: Physician Assistant

## 2018-03-28 DIAGNOSIS — F3341 Major depressive disorder, recurrent, in partial remission: Secondary | ICD-10-CM

## 2018-03-28 DIAGNOSIS — F418 Other specified anxiety disorders: Secondary | ICD-10-CM

## 2018-04-02 ENCOUNTER — Ambulatory Visit: Payer: BLUE CROSS/BLUE SHIELD | Admitting: Physician Assistant

## 2018-04-04 ENCOUNTER — Encounter: Payer: Self-pay | Admitting: Physician Assistant

## 2018-04-09 ENCOUNTER — Other Ambulatory Visit: Payer: Self-pay | Admitting: Physician Assistant

## 2018-04-09 DIAGNOSIS — F418 Other specified anxiety disorders: Secondary | ICD-10-CM

## 2018-04-10 ENCOUNTER — Encounter: Payer: Self-pay | Admitting: Physician Assistant

## 2018-04-10 DIAGNOSIS — F419 Anxiety disorder, unspecified: Secondary | ICD-10-CM

## 2018-04-10 DIAGNOSIS — F411 Generalized anxiety disorder: Secondary | ICD-10-CM

## 2018-04-10 DIAGNOSIS — F322 Major depressive disorder, single episode, severe without psychotic features: Secondary | ICD-10-CM

## 2018-04-10 DIAGNOSIS — F5105 Insomnia due to other mental disorder: Secondary | ICD-10-CM

## 2018-04-10 MED ORDER — BUSPIRONE HCL 7.5 MG PO TABS: 7.5000 mg | ORAL_TABLET | Freq: Two times a day (BID) | ORAL | 2 refills | Status: DC

## 2018-04-10 MED ORDER — TRAZODONE HCL 50 MG PO TABS: 25.0000 mg | ORAL_TABLET | Freq: Every evening | ORAL | 2 refills | Status: DC | PRN

## 2018-04-10 MED ORDER — DULOXETINE HCL 40 MG PO CPEP: 40.0000 mg | ORAL_CAPSULE | Freq: Every day | ORAL | 0 refills | Status: DC

## 2018-04-11 NOTE — Telephone Encounter (Signed)
I changed her to Buspar. MyChart message was sent

## 2018-04-12 MED ORDER — HYDROXYZINE HCL 25 MG PO TABS: 25.0000 mg | ORAL_TABLET | Freq: Three times a day (TID) | ORAL | 1 refills | Status: DC | PRN

## 2018-04-22 ENCOUNTER — Other Ambulatory Visit: Payer: Self-pay | Admitting: Physician Assistant

## 2018-04-22 DIAGNOSIS — F418 Other specified anxiety disorders: Secondary | ICD-10-CM

## 2018-04-22 DIAGNOSIS — F3341 Major depressive disorder, recurrent, in partial remission: Secondary | ICD-10-CM

## 2018-05-02 ENCOUNTER — Other Ambulatory Visit: Payer: Self-pay | Admitting: Physician Assistant

## 2018-05-02 DIAGNOSIS — F411 Generalized anxiety disorder: Secondary | ICD-10-CM

## 2018-05-02 DIAGNOSIS — F419 Anxiety disorder, unspecified: Secondary | ICD-10-CM

## 2018-05-02 DIAGNOSIS — F5105 Insomnia due to other mental disorder: Principal | ICD-10-CM

## 2018-05-06 ENCOUNTER — Encounter: Payer: Self-pay | Admitting: Physician Assistant

## 2018-05-07 ENCOUNTER — Encounter: Payer: Self-pay | Admitting: Physician Assistant

## 2018-05-08 ENCOUNTER — Other Ambulatory Visit: Payer: Self-pay | Admitting: Physician Assistant

## 2018-05-08 DIAGNOSIS — F411 Generalized anxiety disorder: Secondary | ICD-10-CM

## 2018-05-09 ENCOUNTER — Encounter: Payer: Self-pay | Admitting: Physician Assistant

## 2018-05-09 ENCOUNTER — Ambulatory Visit (INDEPENDENT_AMBULATORY_CARE_PROVIDER_SITE_OTHER): Payer: BLUE CROSS/BLUE SHIELD | Admitting: Physician Assistant

## 2018-05-09 VITALS — BP 117/72 | HR 90 | Temp 98.2°F | Wt 153.0 lb

## 2018-05-09 DIAGNOSIS — K582 Mixed irritable bowel syndrome: Secondary | ICD-10-CM | POA: Diagnosis not present

## 2018-05-09 DIAGNOSIS — F419 Anxiety disorder, unspecified: Secondary | ICD-10-CM | POA: Diagnosis not present

## 2018-05-09 DIAGNOSIS — F5105 Insomnia due to other mental disorder: Secondary | ICD-10-CM

## 2018-05-09 DIAGNOSIS — F324 Major depressive disorder, single episode, in partial remission: Secondary | ICD-10-CM | POA: Diagnosis not present

## 2018-05-09 DIAGNOSIS — F411 Generalized anxiety disorder: Secondary | ICD-10-CM

## 2018-05-09 MED ORDER — DULOXETINE HCL 60 MG PO CPEP: 60.0000 mg | ORAL_CAPSULE | Freq: Every day | ORAL | 0 refills | Status: DC

## 2018-05-09 MED ORDER — TRAZODONE HCL 50 MG PO TABS: 50.0000 mg | ORAL_TABLET | Freq: Every day | ORAL | 0 refills | Status: DC

## 2018-05-09 MED ORDER — HYOSCYAMINE SULFATE 0.125 MG PO TABS: 0.1250 mg | ORAL_TABLET | ORAL | 0 refills | Status: DC | PRN

## 2018-05-09 NOTE — Progress Notes (Signed)
HPI:                                                                Christina Munoz is a 18 y.o. female who presents to [name] today for abdominal pain  Recurrent nausea, vomiting, alternating constipation/diarrhea, and upper abdominal pain Protonix, Bentyl, Carafate have not been helpful She missed 2 days of work last week due to symptoms Reports symptoms have been gradually improving, but have not fully resolved.  Anxiety/Depression Overall feeling improved on Cymbalta compared to Lexapro She is still experiencing a lot of anxiety, more so over the last week while she has been sick with GI illness and missing working Trazodone 50 mg nightly is helping with sleep Taking Buspar 7.5 mg twice a day and an additional dose as needed. This is working better than Clonazepam Denies symptoms of mania/hypomania. Denies suicidal thinking. Denies auditory/visual hallucinations.   Depression screen [facility] 2/9 05/09/2018 03/05/2018 01/30/2018 01/03/2018  Decreased Interest 0 2 0 3  Down, Depressed, Hopeless 0 1 0 3  PHQ - 2 Score 0 3 0 6  Altered sleeping 0 3 3 3   Tired, decreased energy 0 3 3 3   Change in appetite 0 3 0 3  Feeling bad or failure about yourself  3 0 0 3  Trouble concentrating 3 0 0 3  Moving slowly or fidgety/restless 3 0 0 2  Suicidal thoughts 0 0 0 3  PHQ-9 Score 9 12 6 26   Difficult doing work/chores Extremely dIfficult - Extremely dIfficult Extremely dIfficult    GAD 7 : Generalized Anxiety Score 05/09/2018 03/05/2018 01/30/2018 01/03/2018  Nervous, Anxious, on Edge 3 3 3 3   Control/stop worrying 3 3 3 3   Worry too much - different things 3 3 3 3   Trouble relaxing 3 3 3 3   Restless 3 3 3 3   Easily annoyed or irritable 3 3 3 3   Afraid - awful might happen 3 3 3 3   Total GAD 7 Score 21 21 21 21   Anxiety Difficulty Extremely difficult - Extremely difficult Extremely difficult      Past Medical History:  Diagnosis Date  .  History of acute pancreatitis   . History of gallstones   . Hx of migraine headaches   . Lumbar degenerative disc disease 01/23/2018  . PUD (peptic ulcer disease)   . Suicidal ideations 01/03/2018   Past Surgical History:  Procedure Laterality Date  . CHOLECYSTECTOMY    . ESOPHAGOGASTRODUODENOSCOPY    . WISDOM TOOTH EXTRACTION     Social History   Tobacco Use  . Smoking status: Never Smoker  . Smokeless tobacco: Never Used  Substance Use Topics  . Alcohol use: No   family history includes Breast cancer in her paternal grandmother; Depression in her maternal grandmother and mother; Heart disease in her maternal grandmother.    ROS: negative except as noted in the HPI  Medications: Current Outpatient Medications  Medication Sig Dispense Refill  . busPIRone (BUSPAR) 7.5 MG tablet TAKE 1 TABLET (7.5 MG TOTAL) BY MOUTH 2 (TWO) TIMES DAILY. 180 tablet 0  . DULoxetine (CYMBALTA) 60 MG capsule Take 1 capsule (60 mg total) by mouth at bedtime. 90 capsule 0  . EPINEPHrine 0.3 mg/0.3 mL IJ SOAJ injection  INJECT 0.3 ML INTO THE MUSCLE ONCE FOR 1 DOSE  1  . hydrOXYzine (ATARAX/VISTARIL) 25 MG tablet TAKE 1-2 TABLETS (25-50 MG TOTAL) BY MOUTH 3 (THREE) TIMES DAILY AS NEEDED FOR ANXIETY. 270 tablet 0  . hyoscyamine (LEVSIN, ANASPAZ) 0.125 MG tablet Take 1 tablet (0.125 mg total) by mouth every 4 (four) hours as needed for cramping (abdominal pain). 90 tablet 0  . metoprolol succinate (TOPROL-XL) 25 MG 24 hr tablet Take 1 tablet (25 mg total) by mouth daily. 30 tablet 2  . pantoprazole (PROTONIX) 40 MG tablet Take by mouth.    . traZODone (DESYREL) 50 MG tablet Take 1 tablet (50 mg total) by mouth at bedtime. 90 tablet 0   No current facility-administered medications for this visit.    Allergies  Allergen Reactions  . Pineapple Swelling    Throat and tongue swelling  . Strawberry Extract Swelling    Throat and tongue swelling  . Gabapentin Rash  . Imipramine Other (See Comments)     Sweating       Objective:  BP 117/72   Pulse 90   Temp 98.2 F (36.8 C) (Oral)   Wt 153 lb (69.4 kg)   SpO2 99%   BMI 26.26 kg/m  Gen:  alert, not ill-appearing, no distress, appropriate for age HEENT: head normocephalic without obvious abnormality, conjunctiva and cornea clear, trachea midline Pulm: Normal work of breathing, normal phonation Neuro: alert and oriented x 3, no tremor MSK: extremities atraumatic, normal gait and station Skin: intact, no rashes on exposed skin, no jaundice, no cyanosis Psych: appearance casual, cooperative, good eye contact, euthymic mood, affect flat, speech is articulate, thought processes clear and goal-directed, normal judgment, good insight    No results found for this or any previous visit (from the past 72 hour(s)). No results found.    Assessment and Plan: 18 y.o. female with   .Diagnoses and all orders for this visit:  Irritable bowel syndrome with both constipation and diarrhea -     Ambulatory referral to Gastroenterology -     hyoscyamine (LEVSIN, ANASPAZ) 0.125 MG tablet; Take 1 tablet (0.125 mg total) by mouth every 4 (four) hours as needed for cramping (abdominal pain).  Insomnia secondary to anxiety -     traZODone (DESYREL) 50 MG tablet; Take 1 tablet (50 mg total) by mouth at bedtime.  GAD (generalized anxiety disorder) -     DULoxetine (CYMBALTA) 60 MG capsule; Take 1 capsule (60 mg total) by mouth at bedtime.  Major depressive disorder with single episode, in partial remission (HCC)    GAD, MDD PHQ9=9 GAD7=21, however patient states this is just over the last few days (rather than 2 weeks), anxiety has actually been a little better than this She has had a partial response to Duloxetine Increasing to 60 mg QD for better anxiety control Cont Buspar 7.5 mg and Trazodone 50 mg  I suspect her GI symptoms are due to IBS. However, given her history of pancreatitis and PUD and persistent symptoms despite daily PPI and  anti-spasmodic recommend she follow-up with her GI doctor Provided with additional recommendations for managing IBS Switching from Bentyl to Levsin  Patient education and anticipatory guidance given Patient agrees with treatment plan Follow-up in 2 months for anxiety or sooner as needed if symptoms worsen or fail to improve  Levonne Hubert PA-C

## 2018-05-09 NOTE — Patient Instructions (Addendum)
Contact Dr. Orion Modest office to schedule follow-up appointment Phone: [phone] Can try IBGuard and/or Probiotic (like Culturelle) Switch from Dicyclomine to Hyosciamine (Levsin) Avoid gas-producing foods (see IBS diet)  Diet for Irritable Bowel Syndrome When you have irritable bowel syndrome (IBS), it is very important to eat the foods and follow the eating habits that are best for your condition. IBS may cause various symptoms such as pain in the abdomen, constipation, or diarrhea. Choosing the right foods can help to ease the discomfort from these symptoms. Work with your health care provider and diet and nutrition specialist (dietitian) to find the eating plan that will help to control your symptoms. What are tips for following this plan?      Keep a food diary. This will help you identify foods that cause symptoms. Write down: ? What you eat and when you eat it. ? What symptoms you have. ? When symptoms occur in relation to your meals, such as "pain in abdomen 2 hours after dinner."  Eat your meals slowly and in a relaxed setting.  Aim to eat 5-6 small meals per day. Do not skip meals.  Drink enough fluid to keep your urine pale yellow.  Ask your health care provider if you should take an over-the-counter probiotic to help restore healthy bacteria in your gut (digestive tract). ? Probiotics are foods that contain good bacteria and yeasts.  Your dietitian may have specific dietary recommendations for you based on your symptoms. He or she may recommend that you: ? Avoid foods that cause symptoms. Talk with your dietitian about other ways to get the same nutrients that are in those problem foods. ? Avoid foods with gluten. Gluten is a protein that is found in rye, wheat, and barley. ? Eat more foods that contain soluble fiber. Examples of foods with high soluble fiber include oats, seeds, and certain fruits and vegetables. Take a fiber supplement if directed by your  dietitian. ? Reduce or avoid certain foods called FODMAPs. These are foods that contain carbohydrates that are hard to digest. Ask your doctor which foods contain these carbohydrates. What foods are not recommended? The following are some foods and drinks that may make your symptoms worse:  Fatty foods, such as french fries.  Foods that contain gluten, such as pasta and cereal.  Dairy products, such as milk, cheese, and ice cream.  Chocolate.  Alcohol.  Products with caffeine, such as coffee.  Carbonated drinks, such as soda.  Foods that are high in FODMAPs. These include certain fruits and vegetables.  Products with sweeteners such as honey, high fructose corn syrup, sorbitol, and mannitol. The items listed above may not be a complete list of foods and beverages you should avoid. Contact a dietitian for more information. What foods are good sources of fiber? Your health care provider or dietitian may recommend that you eat more foods that contain fiber. Fiber can help to reduce constipation and other IBS symptoms. Add foods with fiber to your diet a little at a time so your body can get used to them. Too much fiber at one time might cause gas and swelling of your abdomen. The following are some foods that are good sources of fiber:  Berries, such as raspberries, strawberries, and blueberries.  Tomatoes.  Carrots.  Brown rice.  Oats.  Seeds, such as chia and pumpkin seeds. The items listed above may not be a complete list of recommended sources of fiber. Contact your dietitian for more options. Where to find more information  International Foundation for Functional Gastrointestinal Disorders: www.iffgd.[employer] of Diabetes and Digestive and Kidney Diseases: CarFli[employer].tn Summary  When you have irritable bowel syndrome (IBS), it is very important to eat the foods and follow the eating habits that are best for your condition.  IBS may cause various  symptoms such as pain in the abdomen, constipation, or diarrhea.  Choosing the right foods can help to ease the discomfort that comes from symptoms.  Keep a food diary. This will help you identify foods that cause symptoms.  Your health care provider or diet and nutrition specialist (dietitian) may recommend that you eat more foods that contain fiber. This information is not intended to replace advice given to you by your health care provider. Make sure you discuss any questions you have with your health care provider. Document Released: 05/14/2003 Document Revised: 09/18/2017 Document Reviewed: 10/25/2016 Elsevier Interactive Patient Education  2019 [employer].

## 2018-05-16 ENCOUNTER — Other Ambulatory Visit: Payer: Self-pay | Admitting: Physician Assistant

## 2018-05-16 DIAGNOSIS — K582 Mixed irritable bowel syndrome: Secondary | ICD-10-CM

## 2018-05-27 ENCOUNTER — Other Ambulatory Visit: Payer: Self-pay | Admitting: Physician Assistant

## 2018-05-27 DIAGNOSIS — K582 Mixed irritable bowel syndrome: Secondary | ICD-10-CM

## 2018-05-30 ENCOUNTER — Other Ambulatory Visit: Payer: Self-pay | Admitting: Physician Assistant

## 2018-05-30 DIAGNOSIS — F411 Generalized anxiety disorder: Secondary | ICD-10-CM

## 2018-06-11 ENCOUNTER — Other Ambulatory Visit: Payer: Self-pay | Admitting: Physician Assistant

## 2018-06-11 DIAGNOSIS — K582 Mixed irritable bowel syndrome: Secondary | ICD-10-CM

## 2018-06-13 ENCOUNTER — Other Ambulatory Visit: Payer: Self-pay | Admitting: Physician Assistant

## 2018-06-13 DIAGNOSIS — K582 Mixed irritable bowel syndrome: Secondary | ICD-10-CM

## 2018-06-20 ENCOUNTER — Encounter: Payer: Self-pay | Admitting: Physician Assistant

## 2018-06-21 ENCOUNTER — Other Ambulatory Visit: Payer: Self-pay

## 2018-06-21 DIAGNOSIS — K582 Mixed irritable bowel syndrome: Secondary | ICD-10-CM

## 2018-06-21 MED ORDER — HYOSCYAMINE SULFATE 0.125 MG PO TABS: 0.1250 mg | ORAL_TABLET | ORAL | 0 refills | Status: DC | PRN

## 2018-06-23 ENCOUNTER — Encounter: Payer: Self-pay | Admitting: Physician Assistant

## 2018-06-25 NOTE — Telephone Encounter (Signed)
Left message for patient to call back to schedule a visit.

## 2018-06-26 ENCOUNTER — Other Ambulatory Visit: Payer: Self-pay

## 2018-06-26 ENCOUNTER — Encounter: Payer: Self-pay | Admitting: Physician Assistant

## 2018-06-26 ENCOUNTER — Ambulatory Visit (INDEPENDENT_AMBULATORY_CARE_PROVIDER_SITE_OTHER): Payer: BLUE CROSS/BLUE SHIELD

## 2018-06-26 ENCOUNTER — Ambulatory Visit (INDEPENDENT_AMBULATORY_CARE_PROVIDER_SITE_OTHER): Payer: BLUE CROSS/BLUE SHIELD | Admitting: Physician Assistant

## 2018-06-26 VITALS — BP 96/63 | HR 71 | Temp 98.4°F | Wt 152.0 lb

## 2018-06-26 DIAGNOSIS — R102 Pelvic and perineal pain: Secondary | ICD-10-CM

## 2018-06-26 LAB — POCT URINALYSIS DIPSTICK
Bilirubin, UA: NEGATIVE
Blood, UA: NEGATIVE
Glucose, UA: NEGATIVE
Leukocytes, UA: NEGATIVE
Nitrite, UA: NEGATIVE
Protein, UA: POSITIVE — AB
Spec Grav, UA: 1.025 (ref 1.010–1.025)
Urobilinogen, UA: 0.2 E.U./dL
pH, UA: 6.5 (ref 5.0–8.0)

## 2018-06-26 LAB — POCT URINE PREGNANCY: Preg Test, Ur: NEGATIVE

## 2018-06-26 MED ORDER — NAPROXEN SODIUM 220 MG PO TABS: 220.0000 mg | ORAL_TABLET | Freq: Two times a day (BID) | ORAL | 0 refills | Status: DC

## 2018-06-26 NOTE — Patient Instructions (Addendum)
Take Aleve 1 tab/cap twice a day with food Apply heating pad for 20 minutes every hour as needed  Pelvic Pain, Female Pelvic pain is pain in your lower abdomen, below your belly button and between your hips. The pain may start suddenly (be acute), keep coming back (be recurring), or last a long time (become chronic). Pelvic pain that lasts longer than 6 months is considered chronic. Pelvic pain may affect your:  Reproductive organs.  Urinary system.  Digestive tract.  Musculoskeletal system. There are many potential causes of pelvic pain. Sometimes, the pain can be a result of digestive or urinary conditions, strained muscles or ligaments, or reproductive conditions. Sometimes the cause of pelvic pain is not known. Follow these instructions at home:   Take over-the-counter and prescription medicines only as told by your health care provider.  Rest as told by your health care provider.  Do not have sex if it hurts.  Keep a journal of your pelvic pain. Write down: ? When the pain started. ? Where the pain is located. ? What seems to make the pain better or worse, such as food or your period (menstrual cycle). ? Any symptoms you have along with the pain.  Keep all follow-up visits as told by your health care provider. This is important. Contact a health care provider if:  Medicine does not help your pain.  Your pain comes back.  You have new symptoms.  You have abnormal vaginal discharge or bleeding, including bleeding after menopause.  You have a fever or chills.  You are constipated.  You have blood in your urine or stool.  You have foul-smelling urine.  You feel weak or light-headed. Get help right away if:  You have sudden severe pain.  Your pain gets steadily worse.  You have severe pain along with fever, nausea, vomiting, or excessive sweating.  You lose consciousness. Summary  Pelvic pain is pain in your lower abdomen, below your belly button and  between your hips.  There are many potential causes of pelvic pain.  Keep a journal of your pelvic pain. This information is not intended to replace advice given to you by your health care provider. Make sure you discuss any questions you have with your health care provider. Document Released: 01/19/2004 Document Revised: 08/09/2017 Document Reviewed: 08/09/2017 Elsevier Interactive Patient Education  2019 [employer].

## 2018-06-26 NOTE — Progress Notes (Signed)
HPI:                                                                Christina Munoz is a 18 y.o. female who presents to [name] today for abdominal pain  Pleasant 18 year old female with history of gallstone pancreatitis, status post cholecystectomy,, peptic ulcer disease presents with approximately 2 weeks of right sided abdominal/pelvic pain described as pressure and aching.  Pain is moderate, persistent, worse with palpation. Has not tried any treatments for this.  Pain preceded her menstrual cycle by about 2 days. She had a normal menses lasting about 3 days, no menorrhagia. No associated nausea, vomiting, change in bowel or bladder habits.  Denies any abnormal vaginal discharge or odor. She is not currently sexually active, reports last sexual activity was approximately 4-1/2 months ago. STI testing in January 2020 was negative apart from BV.   Past Medical History:  Diagnosis Date  . History of acute pancreatitis   . History of gallstones   . Hx of migraine headaches   . Lumbar degenerative disc disease 01/23/2018  . PUD (peptic ulcer disease)   . Suicidal ideations 01/03/2018   Past Surgical History:  Procedure Laterality Date  . CHOLECYSTECTOMY    . ESOPHAGOGASTRODUODENOSCOPY    . WISDOM TOOTH EXTRACTION     Social History   Tobacco Use  . Smoking status: Never Smoker  . Smokeless tobacco: Never Used  Substance Use Topics  . Alcohol use: No   family history includes Breast cancer in her paternal grandmother; Depression in her maternal grandmother and mother; Heart disease in her maternal grandmother.    ROS: negative except as noted in the HPI  Medications: Current Outpatient Medications  Medication Sig Dispense Refill  . busPIRone (BUSPAR) 7.5 MG tablet TAKE 1 TABLET (7.5 MG TOTAL) BY MOUTH 2 (TWO) TIMES DAILY. 180 tablet 0  . DULoxetine (CYMBALTA) 60 MG capsule Take 1 capsule (60 mg total) by mouth at bedtime. 90  capsule 0  . EPINEPHrine 0.3 mg/0.3 mL IJ SOAJ injection INJECT 0.3 ML INTO THE MUSCLE ONCE FOR 1 DOSE  1  . hydrOXYzine (ATARAX/VISTARIL) 25 MG tablet TAKE 1-2 TABLETS (25-50 MG TOTAL) BY MOUTH 3 (THREE) TIMES DAILY AS NEEDED FOR ANXIETY. 270 tablet 0  . hyoscyamine (LEVSIN) 0.125 MG tablet Take 1 tablet (0.125 mg total) by mouth every 4 (four) hours as needed for cramping (abdominal pain). 270 tablet 0  . metoprolol succinate (TOPROL-XL) 25 MG 24 hr tablet Take 1 tablet (25 mg total) by mouth daily. 30 tablet 2  . pantoprazole (PROTONIX) 40 MG tablet Take by mouth.    . traZODone (DESYREL) 50 MG tablet Take 1 tablet (50 mg total) by mouth at bedtime. 90 tablet 0  . naproxen sodium (ALEVE) 220 MG tablet Take 1 tablet (220 mg total) by mouth 2 (two) times daily with a meal for 7 days. 14 tablet 0   No current facility-administered medications for this visit.    Allergies  Allergen Reactions  . Pineapple Swelling    Throat and tongue swelling  . Strawberry Extract Swelling    Throat and tongue swelling  . Gabapentin Rash  . Imipramine Other (See Comments)    Sweating  Objective:  BP 96/63   Pulse 71   Temp 98.4 F (36.9 C) (Oral)   Wt 152 lb (68.9 kg)   LMP 06/14/2018   BMI 26.09 kg/m  Gen:  alert, not ill-appearing, no distress, appropriate for age HEENT: head normocephalic without obvious abnormality, conjunctiva and cornea clear, wearing glasses, trachea midline Pulm: Normal work of breathing, normal phonation GI: abdomen normal appearing, soft, RLQ, periumbilical and suprapubic tenderness, negative heel jar and psoas sign, no guarding or rigidity Neuro: alert and oriented x 3, no tremor MSK: extremities atraumatic, normal gait and station Skin: intact, no rashes on exposed skin, no jaundice, no cyanosis   Results for orders placed or performed in visit on 06/26/18 (from the past 72 hour(s))  POCT urine pregnancy     Status: Normal   Collection Time: 06/26/18  2:37  PM  Result Value Ref Range   Preg Test, Ur Negative Negative  POCT Urinalysis Dipstick     Status: Abnormal   Collection Time: 06/26/18  2:37 PM  Result Value Ref Range   Color, UA dark yellow    Clarity, UA clear    Glucose, UA Negative Negative   Bilirubin, UA negative    Ketones, UA trace    Spec Grav, UA 1.025 1.010 - 1.025   Blood, UA negative    pH, UA 6.5 5.0 - 8.0   Protein, UA Positive (A) Negative   Urobilinogen, UA 0.2 0.2 or 1.0 E.U./dL   Nitrite, UA negative    Leukocytes, UA Negative Negative   Appearance     Odor     No results found.    Assessment and Plan: 18 y.o. female with   .Christina Munoz was seen today for abdominal pain.  Diagnoses and all orders for this visit:  Pelvic pain in female -     POCT urine pregnancy -     POCT Urinalysis Dipstick -     US PELVIC COMPLETE W TRANSVAGINAL AND TORSION R/O -     naproxen sodium (ALEVE) 220 MG tablet; Take 1 tablet (220 mg total) by mouth 2 (two) times daily with a meal for 7 days.  Afebrile, no tachypnea, no tachycardia, benign abdominal exam Pelvic transvaginal ultrasound today to assess for ovarian cyst/mass Urine hCG negative UA unremarkable Recent STI testing negative  Conservative management with Aleve 220 mg twice a day with food and local heat 20 minutes every 1-2 hours   Patient education and anticipatory guidance given Patient agrees with treatment plan Follow-up in 2 to 3 days or sooner as needed if symptoms worsen or fail to improve  Levonne Hubertharley E. Soniya Ashraf PA-C

## 2018-06-28 ENCOUNTER — Encounter: Payer: Self-pay | Admitting: Physician Assistant

## 2018-06-28 ENCOUNTER — Ambulatory Visit (INDEPENDENT_AMBULATORY_CARE_PROVIDER_SITE_OTHER): Payer: BLUE CROSS/BLUE SHIELD | Admitting: Physician Assistant

## 2018-06-28 VITALS — Temp 98.4°F

## 2018-06-28 DIAGNOSIS — K581 Irritable bowel syndrome with constipation: Secondary | ICD-10-CM

## 2018-06-28 DIAGNOSIS — K5900 Constipation, unspecified: Secondary | ICD-10-CM

## 2018-06-28 MED ORDER — LINACLOTIDE 72 MCG PO CAPS: 72.0000 ug | ORAL_CAPSULE | Freq: Every day | ORAL | 0 refills | Status: DC

## 2018-06-28 NOTE — Progress Notes (Signed)
Virtual Visit via Telephone Note  I connected with Christina Munoz on 06/28/18 at  1:00 PM EDT by telephone and verified that I am speaking with the correct person using two identifiers.   I discussed the limitations, risks, security and privacy concerns of performing an evaluation and management service by telephone and the availability of in person appointments. I also discussed with the patient that there may be a patient responsible charge related to this service. The patient expressed understanding and agreed to proceed.   History of Present Illness: HPI:                                                                Christina MerlJenna Munoz is a 18 y.o. female   CC:   Reports pain is unchanged. Pain is mostly a 5 at rest, up to a 7-8  Eating and drinking normally. Denies nausea or vomiting Last BM yesterday, slightly loose Treatments tried - heating pad (mild relief), did not take any OTC pain relievers  Work-up including urinalysis, urine hCG and pelvic ultrasound were unremarkable   Past Medical History:  Diagnosis Date  . History of acute pancreatitis   . History of gallstones   . Hx of migraine headaches   . Lumbar degenerative disc disease 01/23/2018  . PUD (peptic ulcer disease)   . Suicidal ideations 01/03/2018   Past Surgical History:  Procedure Laterality Date  . CHOLECYSTECTOMY    . ESOPHAGOGASTRODUODENOSCOPY    . WISDOM TOOTH EXTRACTION     Social History   Tobacco Use  . Smoking status: Never Smoker  . Smokeless tobacco: Never Used  Substance Use Topics  . Alcohol use: No   family history includes Breast cancer in her paternal grandmother; Depression in her maternal grandmother and mother; Heart disease in her maternal grandmother.    ROS: negative except as noted in the HPI  Medications: Current Outpatient Medications  Medication Sig Dispense Refill  . busPIRone (BUSPAR) 7.5 MG tablet TAKE 1 TABLET (7.5 MG TOTAL) BY MOUTH 2 (TWO) TIMES DAILY. 180 tablet 0  .  DULoxetine (CYMBALTA) 60 MG capsule Take 1 capsule (60 mg total) by mouth at bedtime. 90 capsule 0  . EPINEPHrine 0.3 mg/0.3 mL IJ SOAJ injection INJECT 0.3 ML INTO THE MUSCLE ONCE FOR 1 DOSE  1  . hydrOXYzine (ATARAX/VISTARIL) 25 MG tablet TAKE 1-2 TABLETS (25-50 MG TOTAL) BY MOUTH 3 (THREE) TIMES DAILY AS NEEDED FOR ANXIETY. 270 tablet 0  . hyoscyamine (LEVSIN) 0.125 MG tablet Take 1 tablet (0.125 mg total) by mouth every 4 (four) hours as needed for cramping (abdominal pain). 270 tablet 0  . metoprolol succinate (TOPROL-XL) 25 MG 24 hr tablet Take 1 tablet (25 mg total) by mouth daily. 30 tablet 2  . naproxen sodium (ALEVE) 220 MG tablet Take 1 tablet (220 mg total) by mouth 2 (two) times daily with a meal for 7 days. 14 tablet 0  . pantoprazole (PROTONIX) 40 MG tablet Take by mouth.    . traZODone (DESYREL) 50 MG tablet Take 1 tablet (50 mg total) by mouth at bedtime. 90 tablet 0   No current facility-administered medications for this visit.    Allergies  Allergen Reactions  . Pineapple Swelling    Throat and tongue swelling  . Strawberry  Extract Swelling    Throat and tongue swelling  . Gabapentin Rash  . Imipramine Other (See Comments)    Sweating       Objective:  Temp 98.4 F (36.9 C) (Oral)   LMP 06/14/2018     Results for orders placed or performed in visit on 06/26/18 (from the past 72 hour(s))  POCT urine pregnancy     Status: Normal   Collection Time: 06/26/18  2:37 PM  Result Value Ref Range   Preg Test, Ur Negative Negative  POCT Urinalysis Dipstick     Status: Abnormal   Collection Time: 06/26/18  2:37 PM  Result Value Ref Range   Color, UA dark yellow    Clarity, UA clear    Glucose, UA Negative Negative   Bilirubin, UA negative    Ketones, UA trace    Spec Grav, UA 1.025 1.010 - 1.025   Blood, UA negative    pH, UA 6.5 5.0 - 8.0   Protein, UA Positive (A) Negative   Urobilinogen, UA 0.2 0.2 or 1.0 E.U./dL   Nitrite, UA negative    Leukocytes, UA  Negative Negative   Appearance     Odor     US Pelvic Complete W Transvaginal And Torsion R/o  Result Date: 06/26/2018 CLINICAL DATA:  Right-sided pelvic pain for 2 weeks. EXAM: TRANSABDOMINAL AND TRANSVAGINAL ULTRASOUND OF PELVIS TECHNIQUE: Both transabdominal and transvaginal ultrasound examinations of the pelvis were performed. Transabdominal technique was performed for global imaging of the pelvis including uterus, ovaries, adnexal regions, and pelvic cul-de-sac. It was necessary to proceed with endovaginal exam following the transabdominal exam to visualize the endometrium and ovaries. COMPARISON:  CT abdomen and pelvis 01/23/2018 FINDINGS: Uterus Measurements: 7.0 x 3.2 x 4.0 cm = volume: 48 mL. No fibroids or other mass visualized. Endometrium Thickness: 6 mm.  No focal abnormality visualized. Right ovary Measurements: 3.3 x 1.9 x 2.1 cm = volume: 7 mL. Normal appearance/no adnexal mass. Left ovary Measurements: 2.8 x 2.1 x 2.4 cm = volume: 7 mL. Normal appearance/no adnexal mass. Other findings Trace free fluid, likely physiologic. IMPRESSION: Negative pelvic ultrasound. Electronically Signed   By: Sebastian Ache M.D.   On: 06/26/2018 15:47      Assessment and Plan: 18 y.o. female with   .[place] was seen today for follow-up.  Diagnoses and all orders for this visit:  Constipation, unspecified constipation type  Other orders -     linaclotide (LINZESS) 72 MCG capsule; Take 1 capsule (72 mcg total) by mouth daily before breakfast.  Vital signs at office visit 2 days ago were unremarkable, she had a benign abdominal exam, no red flag symptoms, she has had a CT abdomen pelvis within the last 6 months that showed prominent stool throughout the colon Plan to treat for functional abdominal pain/IBS-C with scheduled Levsin 3 times daily, optional probiotic or IBgard, and Linzess   Follow Up Instructions:    I discussed the assessment and treatment plan with the patient. The patient was  provided an opportunity to ask questions and all were answered. The patient agreed with the plan and demonstrated an understanding of the instructions.   The patient was advised to call back or seek an in-person evaluation if the symptoms worsen or if the condition fails to improve as anticipated.  I provided 5-10 minutes of non-face-to-face time during this encounter.   Carlis Stable, [place]

## 2018-07-01 ENCOUNTER — Encounter: Payer: Self-pay | Admitting: Physician Assistant

## 2018-07-02 ENCOUNTER — Other Ambulatory Visit: Payer: Self-pay | Admitting: Physician Assistant

## 2018-07-02 DIAGNOSIS — F322 Major depressive disorder, single episode, severe without psychotic features: Secondary | ICD-10-CM

## 2018-07-02 DIAGNOSIS — F411 Generalized anxiety disorder: Secondary | ICD-10-CM

## 2018-07-04 ENCOUNTER — Other Ambulatory Visit: Payer: Self-pay | Admitting: Physician Assistant

## 2018-07-04 DIAGNOSIS — K582 Mixed irritable bowel syndrome: Secondary | ICD-10-CM

## 2018-07-09 ENCOUNTER — Ambulatory Visit: Payer: BLUE CROSS/BLUE SHIELD | Admitting: Physician Assistant

## 2018-07-15 ENCOUNTER — Other Ambulatory Visit: Payer: Self-pay | Admitting: Physician Assistant

## 2018-07-15 DIAGNOSIS — R002 Palpitations: Secondary | ICD-10-CM

## 2018-07-15 DIAGNOSIS — R000 Tachycardia, unspecified: Secondary | ICD-10-CM

## 2018-07-23 ENCOUNTER — Encounter: Payer: Self-pay | Admitting: Physician Assistant

## 2018-07-24 ENCOUNTER — Telehealth: Payer: Self-pay | Admitting: Physician Assistant

## 2018-07-24 DIAGNOSIS — K582 Mixed irritable bowel syndrome: Secondary | ICD-10-CM

## 2018-07-24 MED ORDER — PLECANATIDE 3 MG PO TABS: 3.0000 mg | ORAL_TABLET | Freq: Every day | ORAL | 3 refills | Status: DC

## 2018-07-24 NOTE — Telephone Encounter (Signed)
Changed to [employer] Please contact patient and let her know she can download a savings card online or from our office

## 2018-07-24 NOTE — Telephone Encounter (Signed)
I was starting on the PA. I had not received anything prior to this. The preferred is [employer]. Please advise.

## 2018-07-25 NOTE — Telephone Encounter (Signed)
Approvedon May 19 ([employer]) Effective from 07/24/2018 through 07/23/2019. Pharmacy aware.

## 2018-07-29 ENCOUNTER — Other Ambulatory Visit: Payer: Self-pay | Admitting: Physician Assistant

## 2018-07-29 DIAGNOSIS — F5105 Insomnia due to other mental disorder: Secondary | ICD-10-CM

## 2018-07-29 DIAGNOSIS — F411 Generalized anxiety disorder: Secondary | ICD-10-CM

## 2018-07-29 DIAGNOSIS — F419 Anxiety disorder, unspecified: Secondary | ICD-10-CM

## 2018-08-06 DIAGNOSIS — L7682 Other postprocedural complications of skin and subcutaneous tissue: Secondary | ICD-10-CM | POA: Diagnosis not present

## 2018-08-24 ENCOUNTER — Other Ambulatory Visit: Payer: Self-pay | Admitting: Physician Assistant

## 2018-08-24 DIAGNOSIS — F419 Anxiety disorder, unspecified: Secondary | ICD-10-CM

## 2018-09-11 ENCOUNTER — Encounter: Payer: Self-pay | Admitting: Physician Assistant

## 2018-09-15 ENCOUNTER — Other Ambulatory Visit: Payer: Self-pay

## 2018-09-15 ENCOUNTER — Encounter: Payer: Self-pay | Admitting: Emergency Medicine

## 2018-09-15 ENCOUNTER — Emergency Department (INDEPENDENT_AMBULATORY_CARE_PROVIDER_SITE_OTHER)
Admission: EM | Admit: 2018-09-15 | Discharge: 2018-09-15 | Disposition: A | Discharge status: Home / Self Care | Payer: BC Managed Care – PPO | Source: Home / Self Care

## 2018-09-15 DIAGNOSIS — R197 Diarrhea, unspecified: Secondary | ICD-10-CM | POA: Diagnosis not present

## 2018-09-15 DIAGNOSIS — R110 Nausea: Secondary | ICD-10-CM | POA: Diagnosis not present

## 2018-09-15 DIAGNOSIS — R51 Headache: Secondary | ICD-10-CM | POA: Diagnosis not present

## 2018-09-15 DIAGNOSIS — R0602 Shortness of breath: Secondary | ICD-10-CM

## 2018-09-15 DIAGNOSIS — R5383 Other fatigue: Secondary | ICD-10-CM

## 2018-09-15 DIAGNOSIS — R42 Dizziness and giddiness: Secondary | ICD-10-CM

## 2018-09-15 DIAGNOSIS — R519 Headache, unspecified: Secondary | ICD-10-CM

## 2018-09-15 LAB — COMPLETE METABOLIC PANEL WITH GFR
AG Ratio: 2.1 (calc) (ref 1.0–2.5)
ALT: 16 U/L (ref 5–32)
AST: 13 U/L (ref 12–32)
Albumin: 4.7 g/dL (ref 3.6–5.1)
Alkaline phosphatase (APISO): 50 U/L (ref 36–128)
BUN: 9 mg/dL (ref 7–20)
CO2: 29 mmol/L (ref 20–32)
Calcium: 10.2 mg/dL (ref 8.9–10.4)
Chloride: 105 mmol/L (ref 98–110)
Creat: 0.68 mg/dL (ref 0.50–1.00)
GFR, Est African American: 147 mL/min/{1.73_m2} (ref >=60)
GFR, Est Non African American: 127 mL/min/{1.73_m2} (ref >=60)
Globulin: 2.2 g/dL (calc) (ref 2.0–3.8)
Glucose, Bld: 78 mg/dL (ref 65–99)
Potassium: 4.4 mmol/L (ref 3.8–5.1)
Sodium: 140 mmol/L (ref 135–146)
Total Bilirubin: 0.4 mg/dL (ref 0.2–1.1)
Total Protein: 6.9 g/dL (ref 6.3–8.2)

## 2018-09-15 LAB — POCT CBC W AUTO DIFF (K'VILLE URGENT CARE)

## 2018-09-15 LAB — POCT URINE PREGNANCY: Preg Test, Ur: NEGATIVE

## 2018-09-15 LAB — POCT URINALYSIS DIP (MANUAL ENTRY)
Bilirubin, UA: NEGATIVE
Glucose, UA: NEGATIVE mg/dL
Ketones, POC UA: NEGATIVE mg/dL
Leukocytes, UA: NEGATIVE
Nitrite, UA: NEGATIVE
Protein Ur, POC: NEGATIVE mg/dL
Spec Grav, UA: 1.01 (ref 1.010–1.025)
Urobilinogen, UA: 0.2 E.U./dL
pH, UA: 6 (ref 5.0–8.0)

## 2018-09-15 MED ORDER — ONDANSETRON 4 MG PO TBDP: 4.0000 mg | ORAL_TABLET | Freq: Three times a day (TID) | ORAL | 0 refills | Status: DC | PRN

## 2018-09-15 NOTE — ED Provider Notes (Signed)
[facility]    CSN: [id number] Arrival date & time: 09/15/18  1331     History   Chief Complaint Chief Complaint  Patient presents with   Dizziness    HPI Christina Munoz is a 18 y.o. female.   HPI  Christina Munoz is a 18 y.o. female presenting to UC with c/o continued generalized headache that started Tuesday (09/11/2018), associated fever of 100*F, mild intermittent SOB, and dizziness. She also c/o nausea and watery diarrhea about 5-6 episodes a day. She has only been trying to drink fruit smoothies due to loss of appetite. She did sent a MyChart message to her PCP, Gena FrayCharley Cummings PA-C on Tuesday, c/o a severe Migraine. Pt was advised to call to schedule a virtual visit, however, pt decided to do a TeleDoc visit instead.  No records on file.  Denies known sick contacts or recent travel. 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 Pt denies chest pain or SOB at this time. No medication taken since last night.    Past Medical History:  Diagnosis Date   History of acute pancreatitis    History of gallstones    Hx of migraine headaches    Lumbar degenerative disc disease 01/23/2018   PUD (peptic ulcer disease)    Suicidal ideations 01/03/2018    Patient Active Problem List   Diagnosis Date Noted   Irritable bowel syndrome with both constipation and diarrhea 05/09/2018   Insomnia secondary to anxiety 05/09/2018   Sinus tachycardia by electrocardiogram 03/09/2018   Palpitations 03/09/2018   Generalized abdominal pain 03/05/2018   GAD (generalized anxiety disorder) 03/05/2018   Pelvic pain in female 01/30/2018   Dyspareunia in female 01/30/2018   Constipation 01/23/2018   Lumbar degenerative disc disease 01/23/2018   EBV infection 01/23/2018   Renal cyst, right 01/19/2018   Bilirubinuria 01/18/2018   Left axillary pain 01/11/2018   Encounter for counseling regarding  contraception 01/11/2018   History of chlamydia infection 01/10/2018   History of migraine with aura 01/03/2018   Anxiety with depression 01/03/2018   Nicotine dependence 01/03/2018   History of acute pancreatitis     Past Surgical History:  Procedure Laterality Date   CHOLECYSTECTOMY     ESOPHAGOGASTRODUODENOSCOPY     WISDOM TOOTH EXTRACTION      OB History    Gravida  0   Para  0   Term  0   Preterm  0   AB  0   Living  0     SAB  0   TAB  0   Ectopic  0   Multiple  0   Live Births  0            Home Medications    Prior to Admission medications   Medication Sig Start Date End Date Taking? Authorizing Provider  busPIRone (BUSPAR) 7.5 MG tablet TAKE 1 TABLET (7.5 MG TOTAL) BY MOUTH 2 (TWO) TIMES DAILY. 07/31/18   Carlis Stableummings, Charley Elizabeth, PA-C  DULoxetine (CYMBALTA) 60 MG capsule TAKE 1 CAPSULE (60 MG TOTAL) BY MOUTH AT BEDTIME. 07/31/18   Carlis Stableummings, Charley Elizabeth, PA-C  EPINEPHrine 0.3 mg/0.3 mL IJ SOAJ injection INJECT 0.3 ML INTO THE MUSCLE ONCE FOR 1 DOSE 06/30/17   [provider]  hydrOXYzine (ATARAX/VISTARIL) 25 MG tablet TAKE 1-2 TABLETS (25-50 MG TOTAL) BY MOUTH 3 (THREE) TIMES DAILY AS NEEDED FOR ANXIETY. 05/08/18   Eddie Candleummings,  Elson Areas, PA-C  hyoscyamine (LEVSIN) 0.125 MG tablet Take 1 tablet (0.125 mg total) by mouth every 4 (four) hours as needed for cramping (abdominal pain). 06/21/18   Trixie Dredge, PA-C  metoprolol succinate (TOPROL-XL) 25 MG 24 hr tablet TAKE 1 TABLET BY MOUTH EVERY DAY 07/16/18   Trixie Dredge, PA-C  ondansetron (ZOFRAN ODT) 4 MG disintegrating tablet Take 1 tablet (4 mg total) by mouth every 8 (eight) hours as needed. 09/15/18   Noe Gens, PA-C  pantoprazole (PROTONIX) 40 MG tablet Take by mouth. 10/12/17   [provider]  Plecanatide (TRULANCE) 3 MG TABS Take 3 mg by mouth daily. 07/24/18   Trixie Dredge, PA-C  traZODone (DESYREL) 50 MG tablet  TAKE 0.5-1 TABLETS (25-50 MG TOTAL) BY MOUTH AT BEDTIME AS NEEDED FOR SLEEP. 07/31/18   Trixie Dredge, PA-C    Family History Family History  Problem Relation Age of Onset   Breast cancer Paternal Grandmother    Heart disease Maternal Grandmother    Depression Maternal Grandmother    Depression Mother     Social History Social History   Tobacco Use   Smoking status: Never Smoker   Smokeless tobacco: Never Used  Substance Use Topics   Alcohol use: No   Drug use: Yes    Types: Marijuana     Allergies   Pineapple, Strawberry extract, Gabapentin, and Imipramine   Review of Systems Review of Systems  Constitutional: Positive for appetite change, fatigue and fever. Negative for chills.  HENT: Negative for congestion, ear pain, sore throat, trouble swallowing and voice change.   Respiratory: Positive for shortness of breath. Negative for cough.   Cardiovascular: Negative for chest pain and palpitations.  Gastrointestinal: Positive for diarrhea and nausea. Negative for abdominal pain and vomiting.  Musculoskeletal: Negative for arthralgias, back pain and myalgias.  Skin: Negative for rash.  Neurological: Positive for dizziness and headaches. Negative for light-headedness.     Physical Exam   No data found.  Updated Vital Signs BP 116/83 (BP Location: Right Arm)    Pulse 90    Temp 98.3 F (36.8 C) (Oral)    Wt 140 lb (63.5 kg)    SpO2 100%    BMI 24.03 kg/m   Visual Acuity Right Eye Distance:   Left Eye Distance:   Bilateral Distance:    Right Eye Near:   Left Eye Near:    Bilateral Near:     Physical Exam Vitals signs and nursing note reviewed.  Constitutional:      General: She is not in acute distress.    Appearance: Normal appearance. She is well-developed. She is not ill-appearing, toxic-appearing or diaphoretic.     Comments: Pt sitting on exam bed, empty juice bottle in her lap. NAD  HENT:     Head: Normocephalic and atraumatic.       Right Ear: Tympanic membrane normal.     Left Ear: Tympanic membrane normal.     Nose: Nose normal.     Right Sinus: No maxillary sinus tenderness or frontal sinus tenderness.     Left Sinus: No maxillary sinus tenderness or frontal sinus tenderness.     Mouth/Throat:     Lips: Pink.     Mouth: Mucous membranes are moist.     Pharynx: Oropharynx is clear. Uvula midline.  Neck:     Musculoskeletal: Normal range of motion.  Cardiovascular:     Rate and Rhythm: Normal rate and regular rhythm.  Pulmonary:  Effort: Pulmonary effort is normal.     Breath sounds: Normal breath sounds.  Abdominal:     General: There is no distension.     Palpations: Abdomen is soft.     Tenderness: There is no abdominal tenderness. There is no right CVA tenderness or left CVA tenderness.  Musculoskeletal: Normal range of motion.  Skin:    General: Skin is warm and dry.     Capillary Refill: Capillary refill takes less than 2 seconds.  Neurological:     General: No focal deficit present.     Mental Status: She is alert and oriented to person, place, and time.  Psychiatric:        Mood and Affect: Mood normal.        Behavior: Behavior normal.      UC Treatments / Results  Labs (all labs ordered are listed, but only abnormal results are displayed) Labs Reviewed  POCT URINALYSIS DIP (MANUAL ENTRY) - Abnormal; Notable for the following components:      Result Value   Blood, UA trace-intact (*)    All other components within normal limits  COMPLETE METABOLIC PANEL WITH GFR  OTHER LAB TEST  POCT CBC W AUTO DIFF ([facility])  POCT URINE PREGNANCY    EKG   Radiology No results found.  Procedures Procedures (including critical care time)  Medications Ordered in UC Medications - No data to display  Initial Impression / Assessment and Plan / UC Course  I have reviewed the triage vital signs and the nursing notes.  Pertinent labs & imaging results that were available during  my care of the patient were reviewed by me and considered in my medical decision making (see chart for details).     Pt appears well, NAD UA and CBC: unremarkable CMP and Covid test pending. Encouraged good hydration and bland diet to help bulk up her stool.  AVS packet given including info on Covid.  Final Clinical Impressions(s) / UC Diagnoses   Final diagnoses:  Nausea without vomiting  Diarrhea, unspecified type  Fatigue, unspecified type  Generalized headache  Shortness of breath  Dizziness     Discharge Instructions      Be sure to get a lot of rest and stay well hydrated with sports drinks, water, diluted juices, and clear sodas.  Avoid fried fatty food, spicy food, and milk as these foods can cause worsening stomach upset.  *It is important to start with a bland diet- Bread, Rice, Apple Sauce, Toast, to help bulk up your stool. Please read this packet for additional food options to help diarrhea.   You may take 500mg  acetaminophen every 4-6 hours or in combination with ibuprofen 400-600mg  every 6-8 hours as needed for pain, inflammation, and fever.  Be sure to well hydrated with clear liquids and get at least 8 hours of sleep at night, preferably more while sick.   Please follow up with family medicine in 1 week if needed.   Please follow up with family medicine in 3-4 days if not improving. Call 911 or go to the hospital if symptoms significantly worsening.     ED Prescriptions    Medication Sig Dispense Auth. Provider   ondansetron (ZOFRAN ODT) 4 MG disintegrating tablet Take 1 tablet (4 mg total) by mouth every 8 (eight) hours as needed. 8 tablet Lurene ShadowPhelps, Tressia Labrum O, PA-C     Controlled Substance Prescriptions [place] Controlled Substance Registry consulted? Not Applicable   Rolla Platehelps, Odin Mariani O, PA-C 09/15/18 [id number]

## 2018-09-15 NOTE — ED Triage Notes (Signed)
Pt c/o fever of 100, SOB and dizziness x4 days. No meds taken today.

## 2018-09-15 NOTE — Discharge Instructions (Signed)
°  Be sure to get a lot of rest and stay well hydrated with sports drinks, water, diluted juices, and clear sodas.  Avoid fried fatty food, spicy food, and milk as these foods can cause worsening stomach upset.  *It is important to start with a bland diet- Bread, Rice, Apple Sauce, Toast, to help bulk up your stool. Please read this packet for additional food options to help diarrhea.   You may take 500mg  acetaminophen every 4-6 hours or in combination with ibuprofen 400-600mg  every 6-8 hours as needed for pain, inflammation, and fever.  Be sure to well hydrated with clear liquids and get at least 8 hours of sleep at night, preferably more while sick.   Please follow up with family medicine in 1 week if needed.   Please follow up with family medicine in 3-4 days if not improving. Call 911 or go to the hospital if symptoms significantly worsening.

## 2018-09-17 ENCOUNTER — Telehealth: Payer: Self-pay

## 2018-09-17 NOTE — Telephone Encounter (Signed)
Left message on VM with CMP results and contact information for any questions.  Will call with COVID results once resulted.

## 2018-10-02 LAB — SARS-COV-2 RNA,(COVID-19) QUALITATIVE NAAT: SARS CoV2 RNA: NOT DETECTED

## 2018-10-02 NOTE — Telephone Encounter (Signed)
Notified patient of negative COVID

## 2018-10-13 ENCOUNTER — Other Ambulatory Visit: Payer: Self-pay | Admitting: Physician Assistant

## 2018-10-13 DIAGNOSIS — R000 Tachycardia, unspecified: Secondary | ICD-10-CM

## 2018-10-13 DIAGNOSIS — R002 Palpitations: Secondary | ICD-10-CM

## 2018-10-23 ENCOUNTER — Other Ambulatory Visit: Payer: Self-pay | Admitting: Physician Assistant

## 2018-10-23 DIAGNOSIS — F411 Generalized anxiety disorder: Secondary | ICD-10-CM

## 2018-11-26 ENCOUNTER — Encounter: Payer: Self-pay | Admitting: Physician Assistant

## 2018-11-27 ENCOUNTER — Encounter: Payer: Self-pay | Admitting: Sports Medicine

## 2018-11-28 ENCOUNTER — Telehealth: Payer: Self-pay | Admitting: Sports Medicine

## 2018-11-28 ENCOUNTER — Encounter: Payer: Self-pay | Admitting: Sports Medicine

## 2018-11-28 DIAGNOSIS — Z3009 Encounter for other general counseling and advice on contraception: Secondary | ICD-10-CM

## 2018-11-28 MED ORDER — ANNOVERA 0.013-0.15 MG/24HR VA RING: 1.0000 | VAGINAL_RING | VAGINAL | 3 refills | Status: DC

## 2018-11-28 NOTE — Telephone Encounter (Signed)
Received fax from [employer] that [place] requires a PA. Information has been sent to the insurance company. Awaiting determination.

## 2018-11-28 NOTE — Assessment & Plan Note (Signed)
Has tried multiple oral contraceptives, is not interested in [place] or Nexplanon. She would like to try the [place] vaginal ring. Initially we will do 21 days and then removal for menstruation. Certainly she could keep it in for a month and then switch to a new one for 3 full months and then stop for menstruation.

## 2018-12-04 NOTE — Telephone Encounter (Signed)
Approved today ([place]) Effective from 11/28/2018 through 11/27/2019. PA approved and pharmacy aware.

## 2018-12-05 NOTE — Telephone Encounter (Signed)
Received a fax from insurance that initially the [place] was approved for a year and now I have another letter from insurance that only allows one ring a year. Can you write an appeal letter? Please advise.

## 2018-12-05 NOTE — Telephone Encounter (Signed)
That must be a mistake, they likely mean 1 ring per month.

## 2018-12-07 NOTE — Telephone Encounter (Signed)
Noted  

## 2018-12-10 ENCOUNTER — Encounter: Payer: Self-pay | Admitting: Sports Medicine

## 2018-12-10 DIAGNOSIS — Z3009 Encounter for other general counseling and advice on contraception: Secondary | ICD-10-CM

## 2018-12-11 MED ORDER — ETONOGESTREL-ETHINYL ESTRADIOL 0.12-0.015 MG/24HR VA RING: VAGINAL_RING | VAGINAL | 12 refills | Status: DC

## 2019-01-02 ENCOUNTER — Encounter: Payer: Self-pay | Admitting: Sports Medicine

## 2019-01-03 ENCOUNTER — Ambulatory Visit: Payer: BC Managed Care – PPO | Admitting: Family Medicine

## 2019-01-03 ENCOUNTER — Encounter: Payer: Self-pay | Admitting: Sports Medicine

## 2019-01-03 NOTE — Telephone Encounter (Signed)
Left patient a voicemail with information below. Let patient know to call us back to schedule an appointment in the office. °

## 2019-01-04 NOTE — Telephone Encounter (Signed)
Left patient a voicemail with information below. Let patient know to call us back to schedule an appointment in the office. °

## 2019-01-06 ENCOUNTER — Other Ambulatory Visit: Payer: Self-pay | Admitting: Physician Assistant

## 2019-01-06 DIAGNOSIS — K582 Mixed irritable bowel syndrome: Secondary | ICD-10-CM

## 2019-02-21 ENCOUNTER — Other Ambulatory Visit: Payer: Self-pay

## 2019-02-21 ENCOUNTER — Emergency Department (INDEPENDENT_AMBULATORY_CARE_PROVIDER_SITE_OTHER)
Admission: EM | Admit: 2019-02-21 | Discharge: 2019-02-21 | Disposition: A | Discharge status: Home / Self Care | Payer: BC Managed Care – PPO | Source: Home / Self Care

## 2019-02-21 DIAGNOSIS — R509 Fever, unspecified: Secondary | ICD-10-CM

## 2019-02-21 DIAGNOSIS — G43009 Migraine without aura, not intractable, without status migrainosus: Secondary | ICD-10-CM

## 2019-02-21 MED ORDER — METOCLOPRAMIDE HCL 5 MG/ML IJ SOLN
10.0000 mg | Freq: Once | INTRAMUSCULAR | Status: AC
  Administered 2019-02-21: 10 mg via INTRAMUSCULAR

## 2019-02-21 MED ORDER — DIPHENHYDRAMINE HCL 50 MG/ML IJ SOLN
25.0000 mg | Freq: Once | INTRAMUSCULAR | Status: AC
  Administered 2019-02-21: 25 mg via INTRAMUSCULAR

## 2019-02-21 MED ORDER — KETOROLAC TROMETHAMINE 60 MG/2ML IM SOLN
60.0000 mg | Freq: Once | INTRAMUSCULAR | Status: AC
  Administered 2019-02-21: 60 mg via INTRAMUSCULAR

## 2019-02-21 NOTE — ED Triage Notes (Signed)
Pt states she has been having a headache for days and this am developed fever and body aches. Temp today 100 at home.

## 2019-02-21 NOTE — ED Provider Notes (Signed)
[facility]    CSN: [id number] Arrival date & time: 02/21/19  1141      History   Chief Complaint Chief Complaint  Patient presents with  . Headache    HPI Christina Munoz is a 18 y.o. female.   Pt complains of a headache.  Pt reports she has a history of migraines.  Pt reports she has done well with migraine cocktails in the past  The history is provided by the patient. No language interpreter was used.  Headache Pain location:  Generalized Quality:  Sharp Timing:  Constant Progression:  Worsening Chronicity:  New Relieved by:  Nothing Worsened by:  Nothing   Past Medical History:  Diagnosis Date  . History of acute pancreatitis   . History of gallstones   . Hx of migraine headaches   . Lumbar degenerative disc disease 01/23/2018  . PUD (peptic ulcer disease)   . Suicidal ideations 01/03/2018    Patient Active Problem List   Diagnosis Date Noted  . Irritable bowel syndrome with both constipation and diarrhea 05/09/2018  . Insomnia secondary to anxiety 05/09/2018  . Sinus tachycardia by electrocardiogram 03/09/2018  . Palpitations 03/09/2018  . Generalized abdominal pain 03/05/2018  . GAD (generalized anxiety disorder) 03/05/2018  . Pelvic pain in female 01/30/2018  . Dyspareunia in female 01/30/2018  . Constipation 01/23/2018  . Lumbar degenerative disc disease 01/23/2018  . EBV infection 01/23/2018  . Renal cyst, right 01/19/2018  . Bilirubinuria 01/18/2018  . Left axillary pain 01/11/2018  . Encounter for counseling regarding contraception 01/11/2018  . History of chlamydia infection 01/10/2018  . History of migraine with aura 01/03/2018  . Anxiety with depression 01/03/2018  . Nicotine dependence 01/03/2018  . History of acute pancreatitis     Past Surgical History:  Procedure Laterality Date  . CHOLECYSTECTOMY    . ESOPHAGOGASTRODUODENOSCOPY    . WISDOM TOOTH EXTRACTION      OB History    Gravida  0   Para  0   Term  0   Preterm  0   AB  0   Living  0     SAB  0   TAB  0   Ectopic  0   Multiple  0   Live Births  0            Home Medications    Prior to Admission medications   Medication Sig Start Date End Date Taking? Authorizing Provider  EPINEPHrine 0.3 mg/0.3 mL IJ SOAJ injection INJECT 0.3 ML INTO THE MUSCLE ONCE FOR 1 DOSE 06/30/17   [provider]  etonogestrel-ethinyl estradiol (NUVARING) 0.12-0.015 MG/24HR vaginal ring Insert vaginally and leave in place for 3 consecutive weeks, then remove for 1 week. 12/11/18   Silverio Decamp, MD  pantoprazole (PROTONIX) 40 MG tablet Take by mouth. 10/12/17   [provider]  DULoxetine (CYMBALTA) 60 MG capsule TAKE 1 CAPSULE (60 MG TOTAL) BY MOUTH AT BEDTIME. 07/31/18 02/21/19  Trixie Dredge, PA-C  hyoscyamine (LEVSIN) 0.125 MG tablet Take 1 tablet (0.125 mg total) by mouth every 4 (four) hours as needed for cramping (abdominal pain). 06/21/18 02/21/19  Trixie Dredge, PA-C  metoprolol succinate (TOPROL-XL) 25 MG 24 hr tablet TAKE 1 TABLET BY MOUTH EVERY DAY 10/15/18 02/21/19  Trixie Dredge, PA-C  traZODone (DESYREL) 50 MG tablet TAKE 0.5-1 TABLETS (25-50 MG TOTAL) BY MOUTH AT BEDTIME AS NEEDED FOR SLEEP. 07/31/18 02/21/19  Trixie Dredge, PA-C    Family History  Family History  Problem Relation Age of Onset  . Breast cancer Paternal Grandmother   . Heart disease Maternal Grandmother   . Depression Maternal Grandmother   . Depression Mother     Social History Social History   Tobacco Use  . Smoking status: Never Smoker  . Smokeless tobacco: Never Used  Substance Use Topics  . Alcohol use: No  . Drug use: Yes    Types: Marijuana     Allergies   Pineapple, Strawberry extract, Gabapentin, and Imipramine   Review of Systems Review of Systems  Neurological: Positive for headaches.  All other systems reviewed and are negative.    Physical Exam Triage  Vital Signs ED Triage Vitals  Enc Vitals Group     BP 02/21/19 1201 110/68     Pulse Rate 02/21/19 1201 89     Resp --      Temp 02/21/19 1201 98.3 F (36.8 C)     Temp Source 02/21/19 1201 Oral     SpO2 02/21/19 1201 99 %     Weight 02/21/19 1158 138 lb (62.6 kg)     Height --      Head Circumference --      Peak Flow --      Pain Score 02/21/19 1158 4     Pain Loc --      Pain Edu? --      Excl. in GC? --    No data found.  Updated Vital Signs BP 110/68 (BP Location: Right Arm)   Pulse 89   Temp 98.3 F (36.8 C) (Oral)   Wt 62.6 kg   LMP 02/21/2019   SpO2 99%   BMI 23.69 kg/m   Visual Acuity Right Eye Distance:   Left Eye Distance:   Bilateral Distance:    Right Eye Near:   Left Eye Near:    Bilateral Near:     Physical Exam Vitals and nursing note reviewed.  Constitutional:      Appearance: She is well-developed.  HENT:     Head: Normocephalic.  Cardiovascular:     Rate and Rhythm: Normal rate.  Pulmonary:     Effort: Pulmonary effort is normal.  Abdominal:     General: There is no distension.  Musculoskeletal:        General: Normal range of motion.     Cervical back: Normal range of motion.  Neurological:     Mental Status: She is alert and oriented to person, place, and time.     Cranial Nerves: No cranial nerve deficit.  Psychiatric:        Mood and Affect: Mood normal.      UC Treatments / Results  Labs (all labs ordered are listed, but only abnormal results are displayed) Labs Reviewed  NOVEL CORONAVIRUS, NAA    EKG   Radiology No results found.  Procedures Procedures (including critical care time)  Medications Ordered in UC Medications  ketorolac (TORADOL) injection 60 mg (60 mg Intramuscular Given 02/21/19 1245)  metoCLOPramide (REGLAN) injection 10 mg (10 mg Intramuscular Given 02/21/19 1246)  diphenhydrAMINE (BENADRYL) injection 25 mg (25 mg Intramuscular Given 02/21/19 1246)    Initial Impression / Assessment and  Plan / UC Course  I have reviewed the triage vital signs and the nursing notes.  Pertinent labs & imaging results that were available during my care of the patient were reviewed by me and considered in my medical decision making (see chart for details).     MDM  Pt given toradol, reglan and benadryl.   Final Clinical Impressions(s) / UC Diagnoses   Final diagnoses:  Migraine without aura and without status migrainosus, not intractable     Discharge Instructions     Your covid test is pending   ED Prescriptions    None     PDMP not reviewed this encounter.  An After Visit Summary was printed and given to the patient.   Elson AreasSofia, Livie Vanderhoof K, [place]PA-C 02/21/19 1458

## 2019-02-21 NOTE — Discharge Instructions (Signed)
Your covid test is pending 

## 2019-02-24 LAB — NOVEL CORONAVIRUS, NAA: SARS-CoV-2, NAA: NOT DETECTED

## 2019-03-18 DIAGNOSIS — R109 Unspecified abdominal pain: Secondary | ICD-10-CM | POA: Diagnosis not present

## 2019-03-18 DIAGNOSIS — R197 Diarrhea, unspecified: Secondary | ICD-10-CM | POA: Diagnosis not present

## 2019-03-18 DIAGNOSIS — R634 Abnormal weight loss: Secondary | ICD-10-CM | POA: Diagnosis not present

## 2019-03-18 DIAGNOSIS — R1084 Generalized abdominal pain: Secondary | ICD-10-CM | POA: Diagnosis not present

## 2019-05-17 DIAGNOSIS — R1013 Epigastric pain: Secondary | ICD-10-CM | POA: Diagnosis not present

## 2019-05-17 DIAGNOSIS — R634 Abnormal weight loss: Secondary | ICD-10-CM | POA: Diagnosis not present

## 2019-05-17 DIAGNOSIS — R197 Diarrhea, unspecified: Secondary | ICD-10-CM | POA: Diagnosis not present

## 2019-05-17 DIAGNOSIS — R1084 Generalized abdominal pain: Secondary | ICD-10-CM | POA: Diagnosis not present

## 2019-05-30 DIAGNOSIS — R002 Palpitations: Secondary | ICD-10-CM

## 2019-05-30 DIAGNOSIS — R000 Tachycardia, unspecified: Secondary | ICD-10-CM

## 2019-05-30 MED ORDER — METOPROLOL SUCCINATE ER 25 MG PO TB24: 25.0000 mg | ORAL_TABLET | Freq: Every day | ORAL | 0 refills | Status: DC

## 2019-05-30 NOTE — Telephone Encounter (Signed)
Forwarding to provider.

## 2019-07-17 MED ORDER — METRONIDAZOLE 500 MG PO TABS: 500.0000 mg | ORAL_TABLET | Freq: Two times a day (BID) | ORAL | 0 refills | Status: AC

## 2019-08-28 ENCOUNTER — Other Ambulatory Visit: Payer: Self-pay | Admitting: Physician Assistant

## 2019-08-28 DIAGNOSIS — R000 Tachycardia, unspecified: Secondary | ICD-10-CM

## 2019-08-28 DIAGNOSIS — R002 Palpitations: Secondary | ICD-10-CM

## 2019-08-28 NOTE — Telephone Encounter (Signed)
I went ahead and refilled it but only with a month, but because she has not been here in a while she needs to go ahead and get in with me for a physical.

## 2019-09-06 ENCOUNTER — Other Ambulatory Visit: Payer: Self-pay | Admitting: Sports Medicine

## 2019-09-06 DIAGNOSIS — R000 Tachycardia, unspecified: Secondary | ICD-10-CM

## 2019-09-06 DIAGNOSIS — R002 Palpitations: Secondary | ICD-10-CM

## 2020-01-14 DIAGNOSIS — K582 Mixed irritable bowel syndrome: Secondary | ICD-10-CM | POA: Diagnosis not present

## 2020-01-14 DIAGNOSIS — K9089 Other intestinal malabsorption: Secondary | ICD-10-CM | POA: Diagnosis not present

## 2020-02-12 DIAGNOSIS — D485 Neoplasm of uncertain behavior of skin: Secondary | ICD-10-CM | POA: Diagnosis not present

## 2020-02-12 DIAGNOSIS — L700 Acne vulgaris: Secondary | ICD-10-CM | POA: Diagnosis not present

## 2020-02-12 DIAGNOSIS — D225 Melanocytic nevi of trunk: Secondary | ICD-10-CM | POA: Diagnosis not present

## 2020-03-08 IMAGING — CT CT ABD-PELV W/ CM
2 of 4 series · 16 of 46 positions shown, 18 images · IV contrast (APPLIED)
Comparison: None.

CLINICAL DATA: Vomiting for 1 week with epigastric abdominal pain.
Tenderness to palpation in the lower abdomen. Cholecystectomy in
October 2016

EXAM:
CT ABDOMEN AND PELVIS WITH CONTRAST
TECHNIQUE: Multidetector CT imaging of the abdomen and pelvis was performed
using the standard protocol following bolus administration of
intravenous contrast.
CONTRAST:  100mL DDT0W0-DXX IOPAMIDOL (DDT0W0-DXX) INJECTION 61%

[Series 2: axial st · axial · 0.82mm/px · z∈[-497,-42]mm · 13 of 99 slices shown, 15 images]
[im 4/99  soft-tissue]
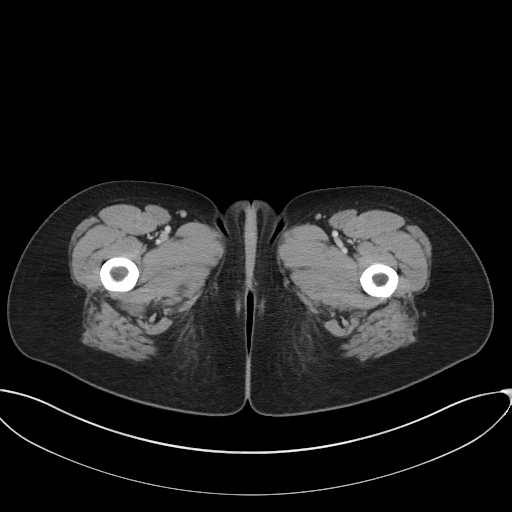
[im 4/99  bone]
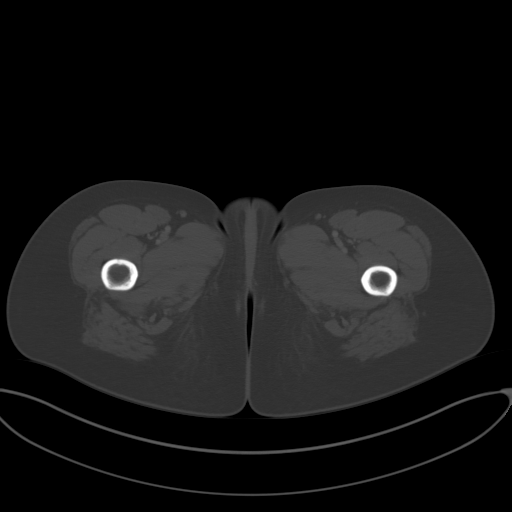
[im 12/99  soft-tissue]
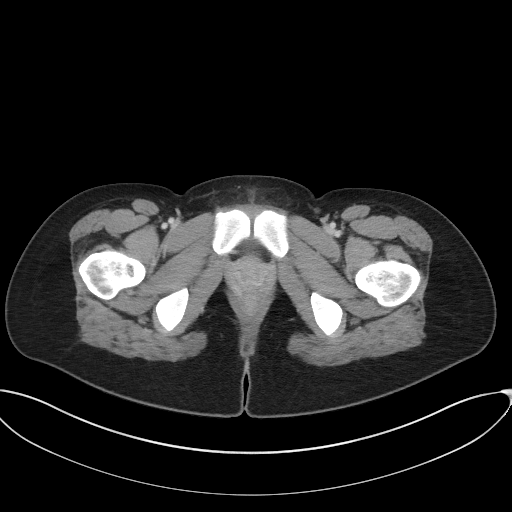
[im 20/99  soft-tissue]
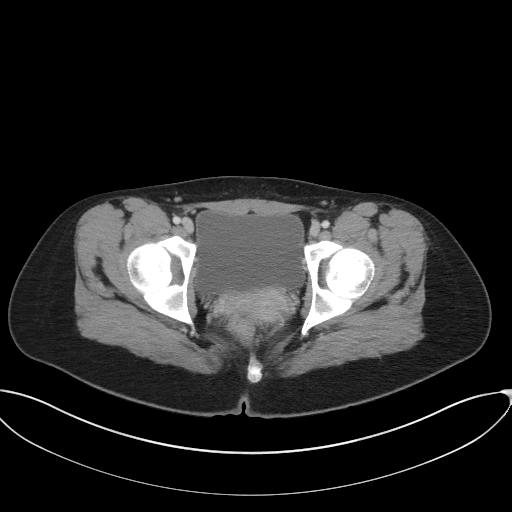
[im 28/99  soft-tissue]
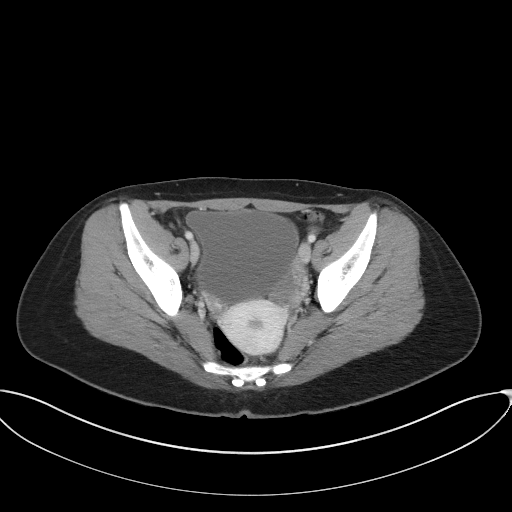
[im 36/99  soft-tissue]
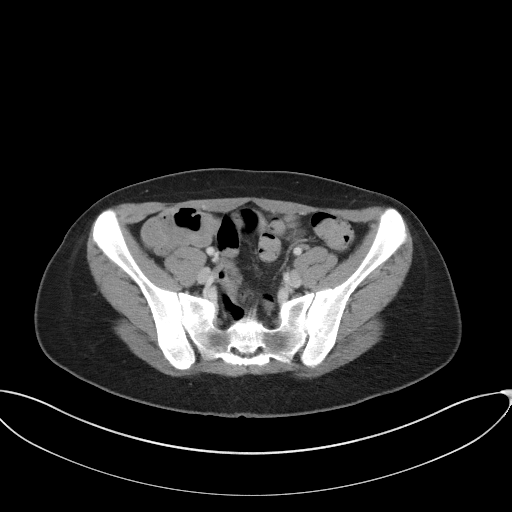
[im 44/99  soft-tissue]
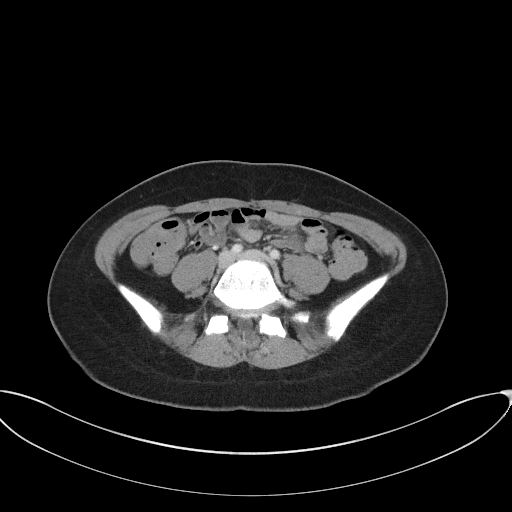
[im 51/99  soft-tissue]
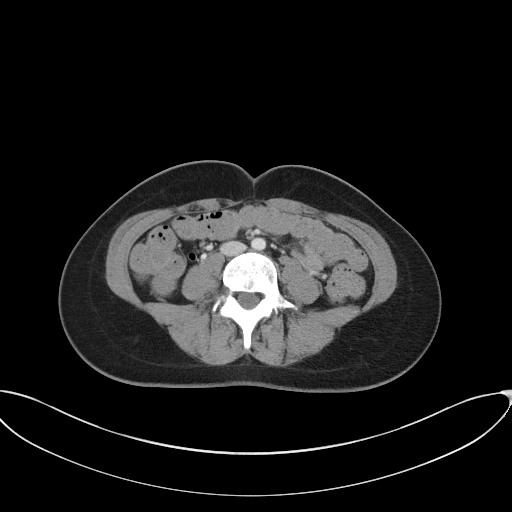
[im 55/99  soft-tissue]
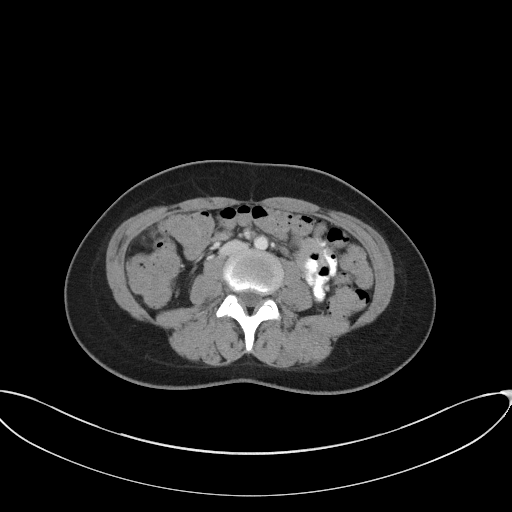
[im 63/99  soft-tissue]
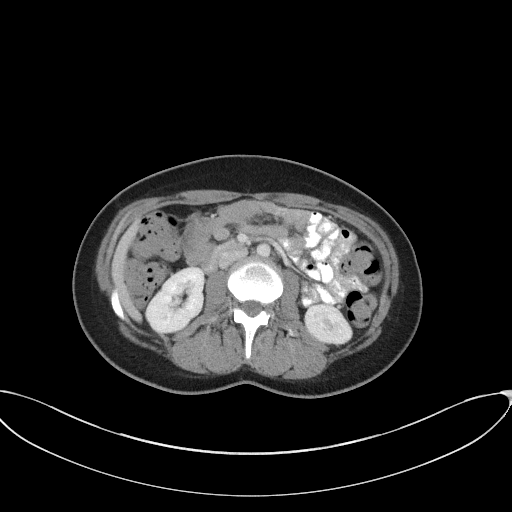
[im 63/99  bone]
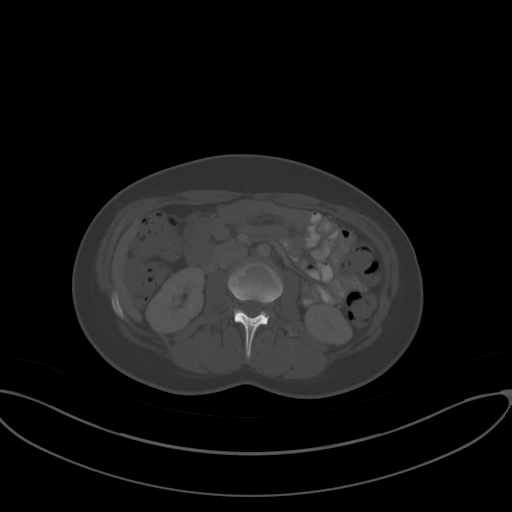
[im 71/99  soft-tissue]
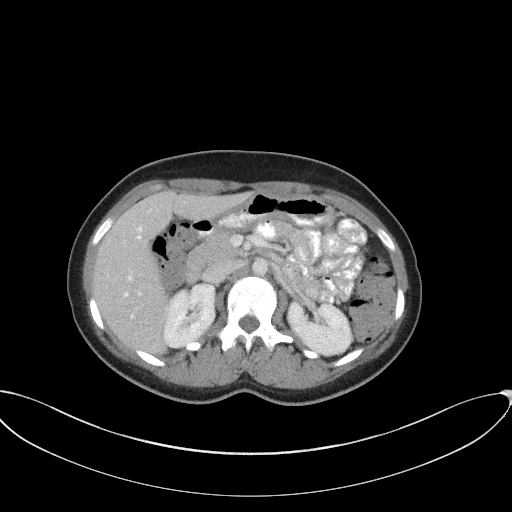
[im 79/99  soft-tissue]
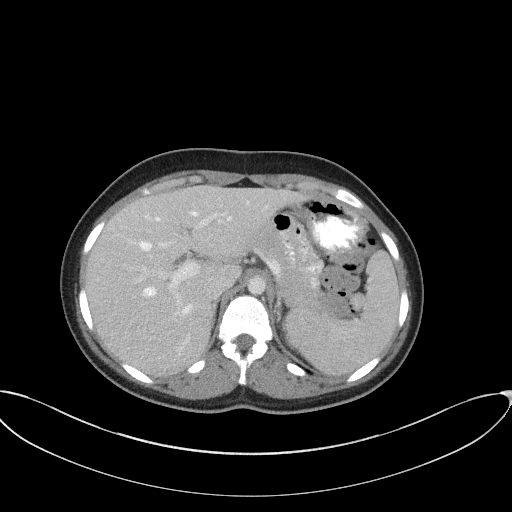
[im 87/99  soft-tissue]
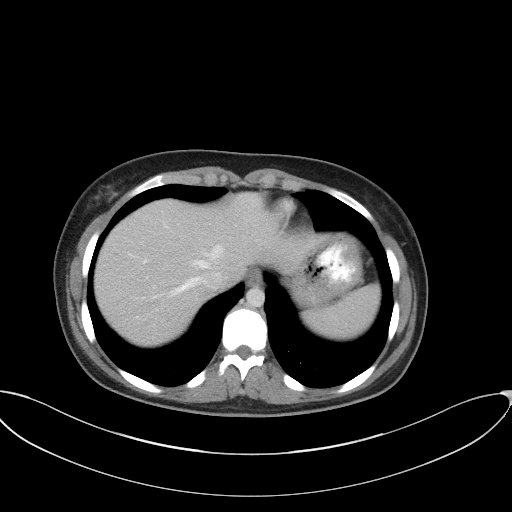
[im 95/99  soft-tissue]
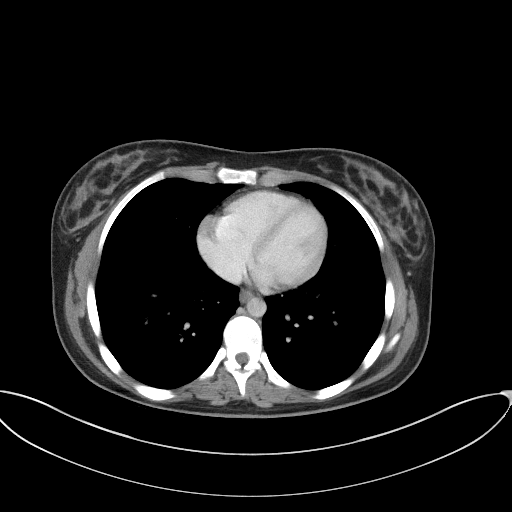

[Series 5: coronal st · coronal · 0.70mm/px · 3 of 73 slices shown]
[im 25/73  soft-tissue]
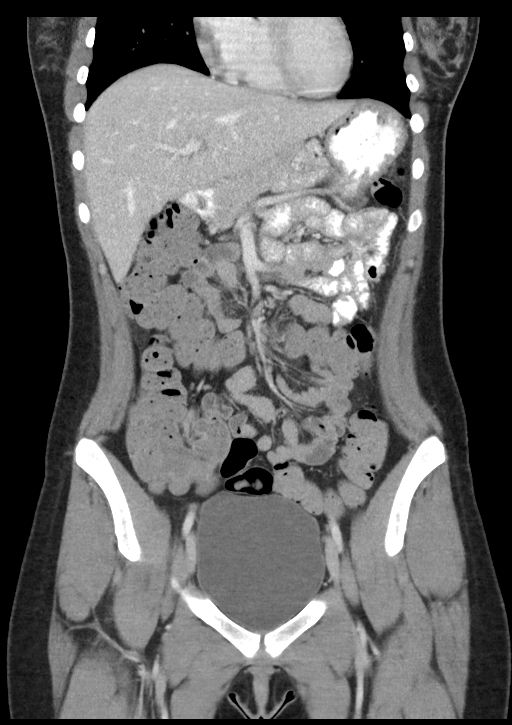
[im 33/73  soft-tissue]
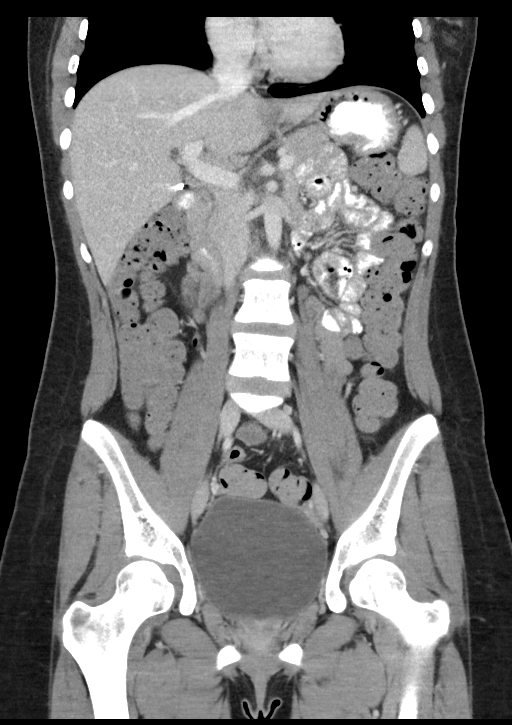
[im 41/73  soft-tissue]
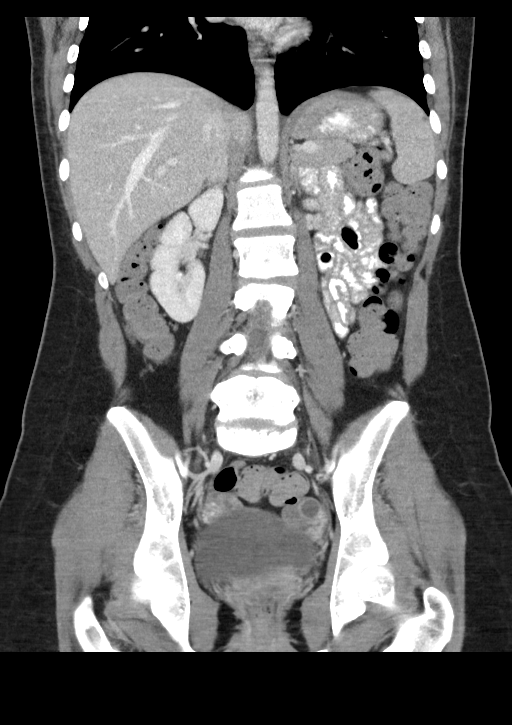

[16 of 46 positions shown; findings below may reference images not displayed]

FINDINGS: Lower chest: Unremarkable

Hepatobiliary: Cholecystectomy. No appreciable complicating feature.
The hepatic parenchyma appears unremarkable.

Pancreas: Unremarkable

Spleen: Unremarkable

Adrenals/Urinary Tract: Unremarkable

Stomach/Bowel: Prominent stool throughout the colon favors
constipation. Normal appendix.

Vascular/Lymphatic: Unremarkable

Reproductive: Unremarkable

Other: Trace free pelvic fluid in the cul-de-sac, likely
physiologic.

Musculoskeletal: Mild degenerative disc disease at L4-5 and L5-S1.
IMPRESSION: 1.  Prominent stool throughout the colon favors constipation.
2. Mild degenerative disc disease at L4-5 and L5-S1.
3. No additional findings to explain the patient's symptoms. The
appendix appears normal.

## 2020-03-11 IMAGING — US US AXILLARY LEFT
1 series · 8 of 8 positions shown · non-contrast
Comparison: None.

CLINICAL DATA: 17-year-old with a long-standing asymmetric palpable
concern in the LEFT axilla.

EXAM:
ULTRASOUND OF THE LEFT AXILLA

[Series 1: us axillary left · 0.06mm/px · 8 of 8 slices shown]
[im 1/8]
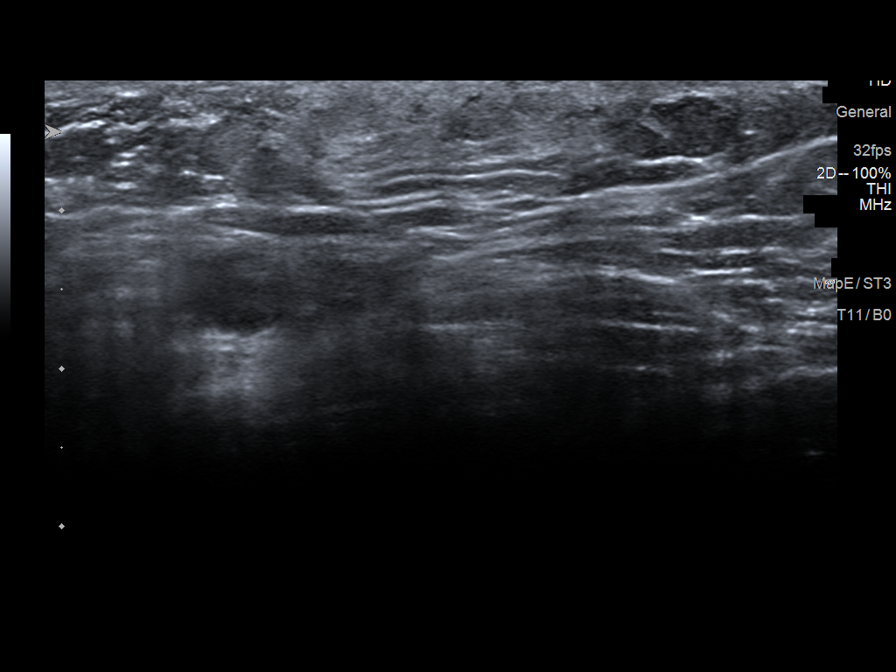
[im 2/8]
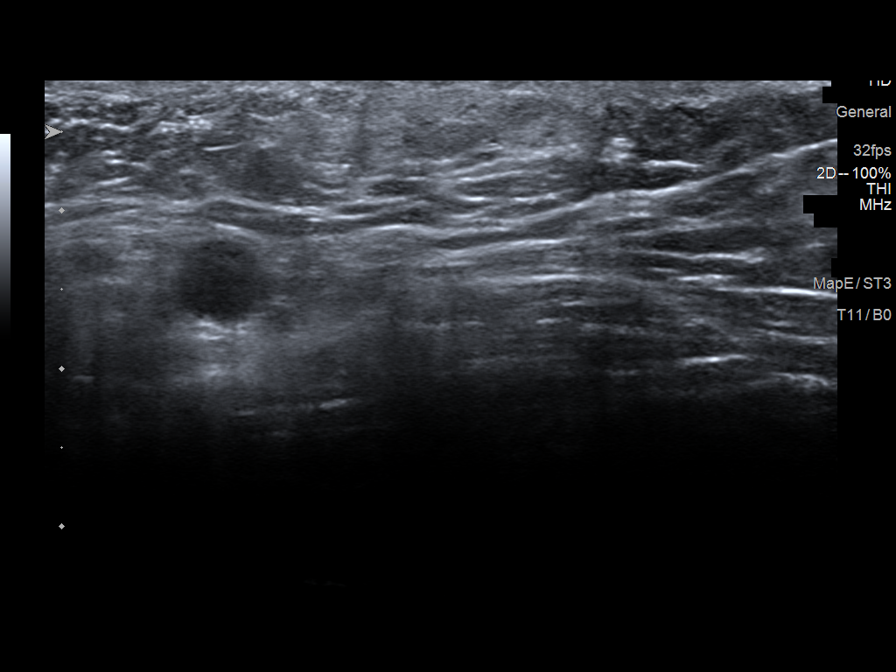
[im 3/8]
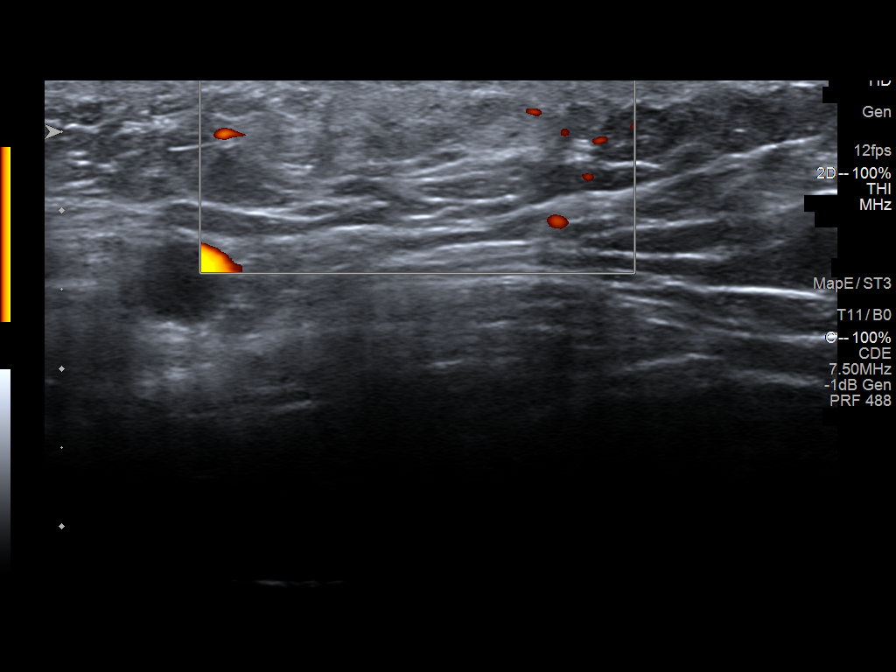
[im 4/8]
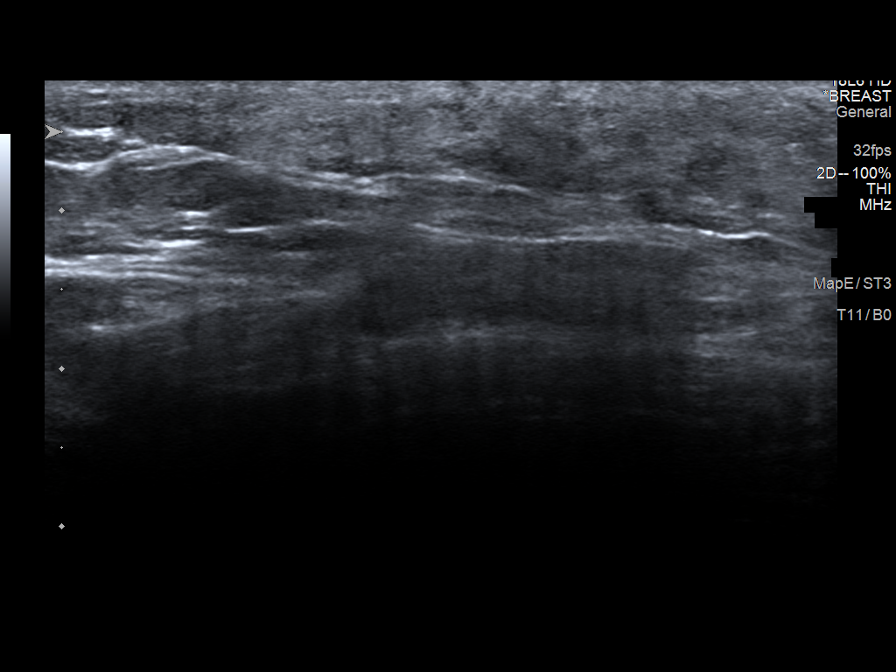
[im 5/8]
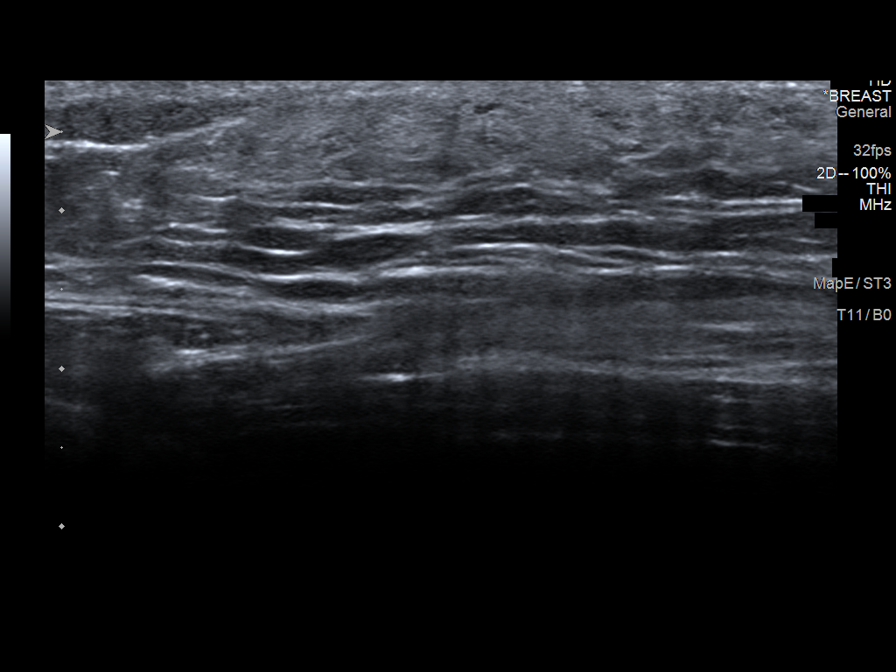
[im 6/8]
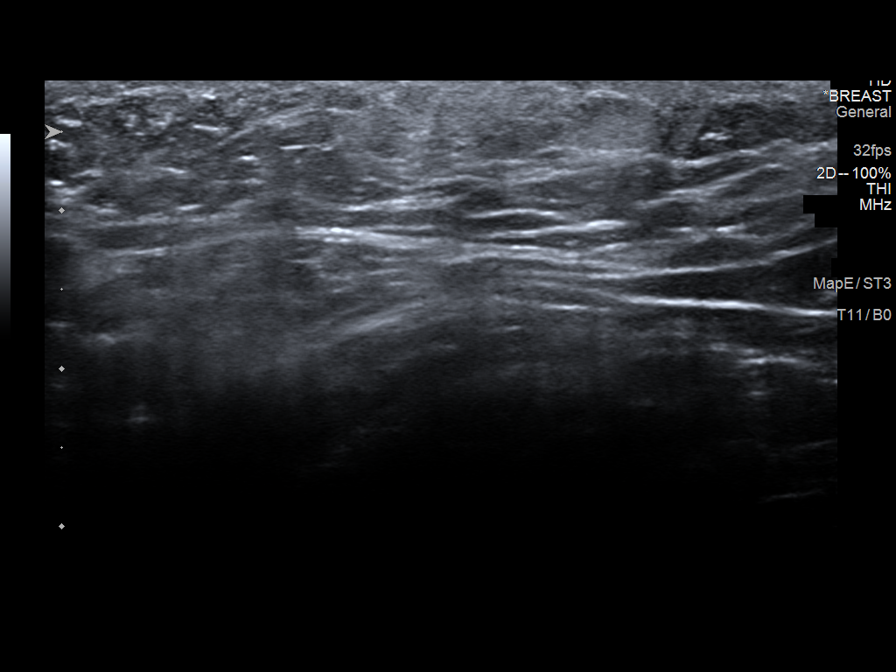
[im 7/8]
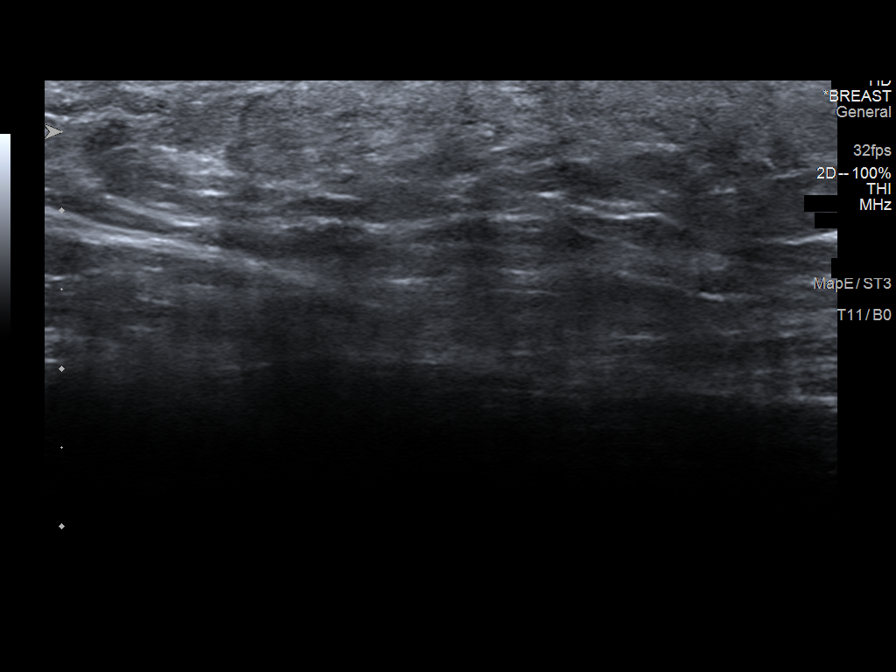
[im 8/8]
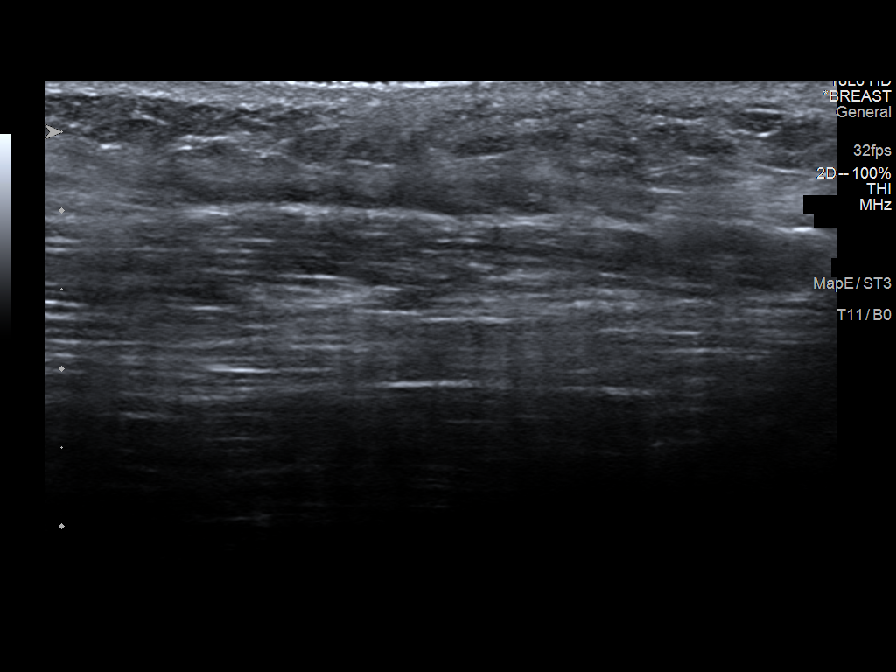

[8 of 8 positions shown; findings below may reference images not displayed]

FINDINGS: On physical exam, the patient's palpable concern in the LEFT axilla
feels like the medial edge of the latissimus dorsi muscle. I do not
palpate pathologic LEFT axillary lymphadenopathy. The muscle is
asymmetric in size when compared to the RIGHT axilla.

LEFT axillary ultrasound is performed, confirming that the palpable
concern indeed corresponds to the edge of the muscle. No mass or
pathologic lymphadenopathy is present.
IMPRESSION: 1. No mass or pathologic lymphadenopathy in the LEFT axilla.
2. The palpable concern corresponds to the medial edge of the
latissimus dorsi muscle.

RECOMMENDATION:
1. No further imaging follow-up is felt necessary unless there are
persistent or intervening clinical concerns.
2. Screening mammogram at age 38.  (Code:X3-K-R17)

I have discussed the findings and recommendations with the patient.
Results were also provided in writing at the conclusion of the
visit.

BI-RADS CATEGORY  1: Negative.

## 2020-03-16 DIAGNOSIS — Z6820 Body mass index (BMI) 20.0-20.9, adult: Secondary | ICD-10-CM | POA: Diagnosis not present

## 2020-03-16 DIAGNOSIS — Z113 Encounter for screening for infections with a predominantly sexual mode of transmission: Secondary | ICD-10-CM | POA: Diagnosis not present

## 2020-03-16 DIAGNOSIS — Z01419 Encounter for gynecological examination (general) (routine) without abnormal findings: Secondary | ICD-10-CM | POA: Diagnosis not present

## 2020-03-18 DIAGNOSIS — K582 Mixed irritable bowel syndrome: Secondary | ICD-10-CM | POA: Diagnosis not present

## 2020-03-18 DIAGNOSIS — Z7689 Persons encountering health services in other specified circumstances: Secondary | ICD-10-CM | POA: Diagnosis not present

## 2020-03-18 DIAGNOSIS — F902 Attention-deficit hyperactivity disorder, combined type: Secondary | ICD-10-CM | POA: Diagnosis not present

## 2020-04-08 DIAGNOSIS — Z0000 Encounter for general adult medical examination without abnormal findings: Secondary | ICD-10-CM | POA: Diagnosis not present

## 2020-04-16 DIAGNOSIS — K9089 Other intestinal malabsorption: Secondary | ICD-10-CM | POA: Diagnosis not present

## 2020-04-16 DIAGNOSIS — K582 Mixed irritable bowel syndrome: Secondary | ICD-10-CM | POA: Diagnosis not present

## 2020-04-16 DIAGNOSIS — R197 Diarrhea, unspecified: Secondary | ICD-10-CM | POA: Diagnosis not present

## 2020-04-16 DIAGNOSIS — R1084 Generalized abdominal pain: Secondary | ICD-10-CM | POA: Diagnosis not present

## 2020-05-12 DIAGNOSIS — K6389 Other specified diseases of intestine: Secondary | ICD-10-CM | POA: Diagnosis not present

## 2020-05-12 DIAGNOSIS — R197 Diarrhea, unspecified: Secondary | ICD-10-CM | POA: Diagnosis not present

## 2020-05-12 DIAGNOSIS — K648 Other hemorrhoids: Secondary | ICD-10-CM | POA: Diagnosis not present

## 2020-07-08 DIAGNOSIS — F902 Attention-deficit hyperactivity disorder, combined type: Secondary | ICD-10-CM | POA: Diagnosis not present

## 2020-08-12 DIAGNOSIS — D2261 Melanocytic nevi of right upper limb, including shoulder: Secondary | ICD-10-CM | POA: Diagnosis not present

## 2020-08-12 DIAGNOSIS — L720 Epidermal cyst: Secondary | ICD-10-CM | POA: Diagnosis not present

## 2020-08-26 IMAGING — US US PELVIS COMPLETE TRANSABD/TRANSVAG W DUPLEX
1 series · 14 of 25 positions shown · non-contrast
Comparison: CT abdomen and pelvis 01/23/2018

CLINICAL DATA: Right-sided pelvic pain for 2 weeks.

EXAM:
TRANSABDOMINAL AND TRANSVAGINAL ULTRASOUND OF PELVIS
TECHNIQUE: Both transabdominal and transvaginal ultrasound examinations of the
pelvis were performed. Transabdominal technique was performed for
global imaging of the pelvis including uterus, ovaries, adnexal
regions, and pelvic cul-de-sac. It was necessary to proceed with
endovaginal exam following the transabdominal exam to visualize the
endometrium and ovaries.

[Series 1: us pelvis complete transabd/transvag w duplex · 0.21mm/px · 14 of 72 slices shown]
[im 1/72]
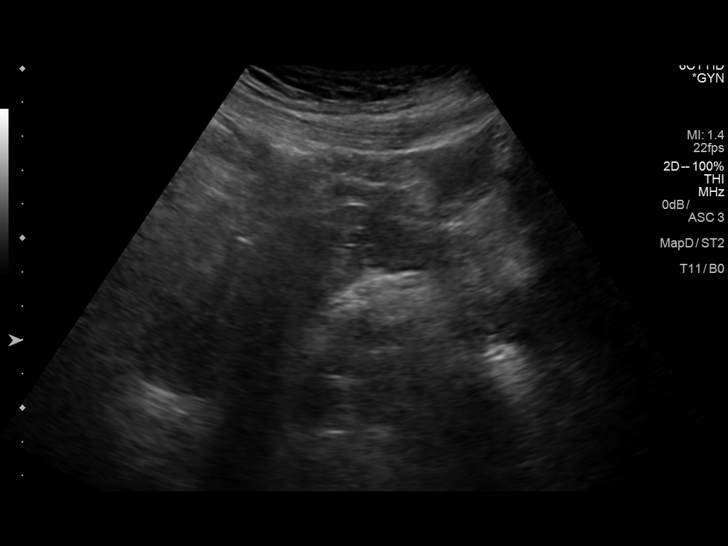
[im 6/72]
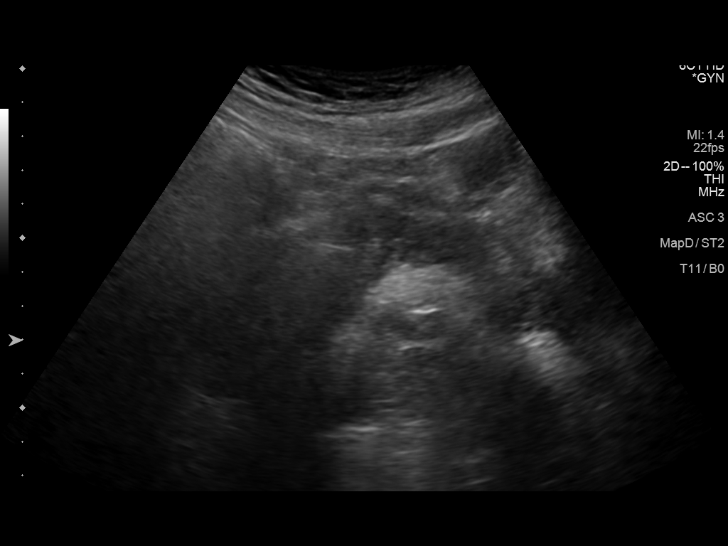
[im 12/72]
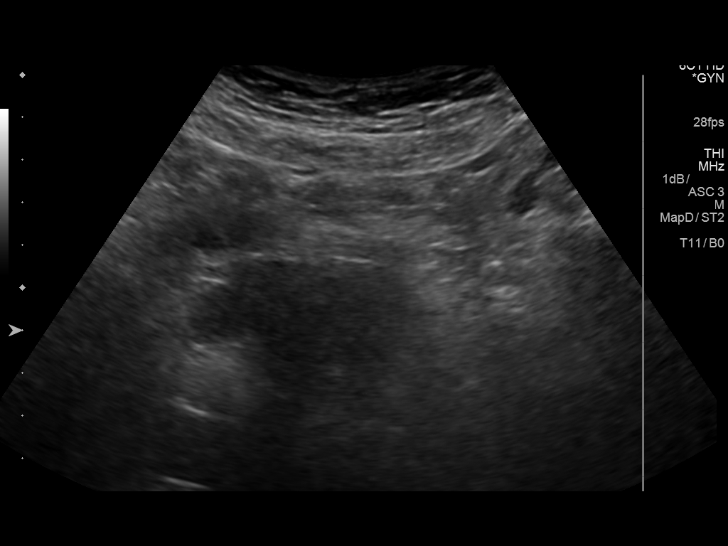
[im 18/72]
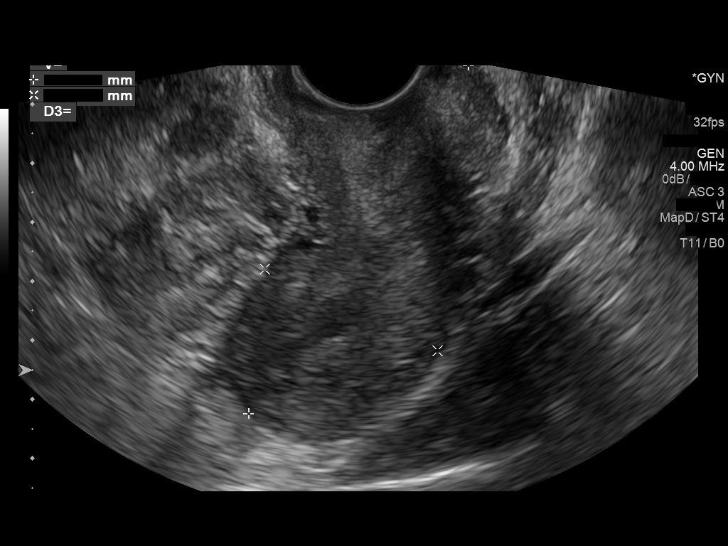
[im 24/72]
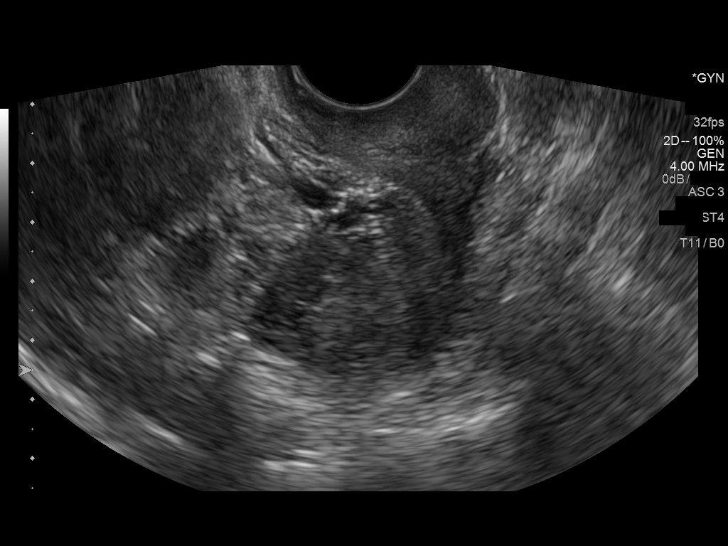
[im 27/72]
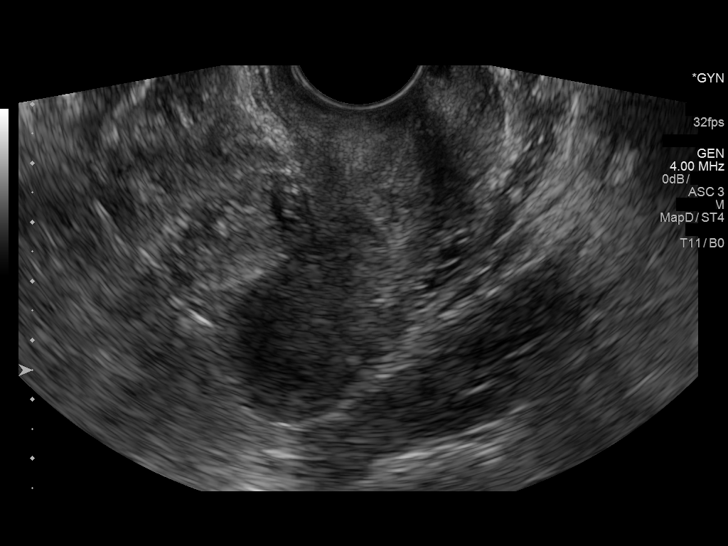
[im 33/72]
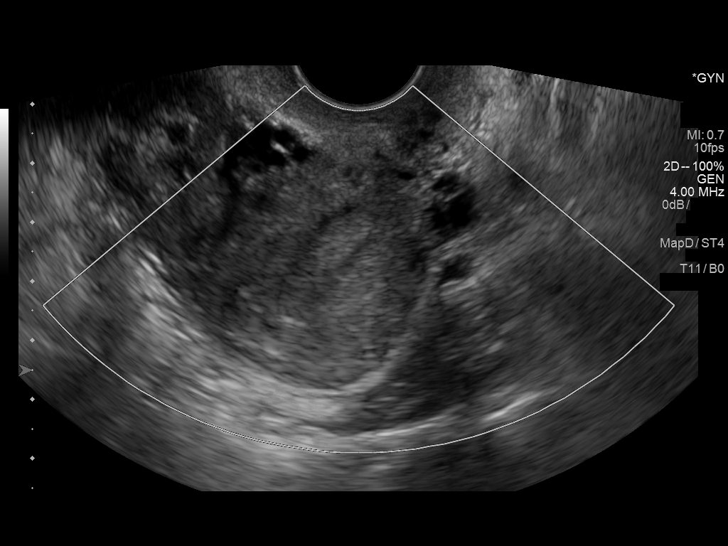
[im 39/72]
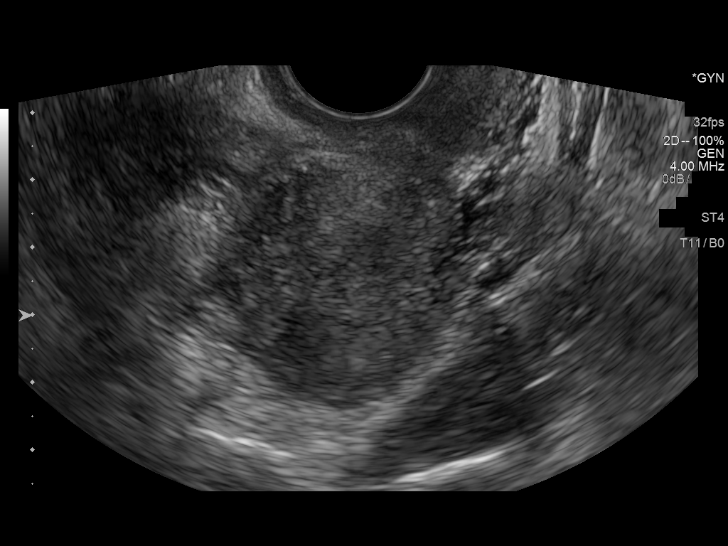
[im 45/72]
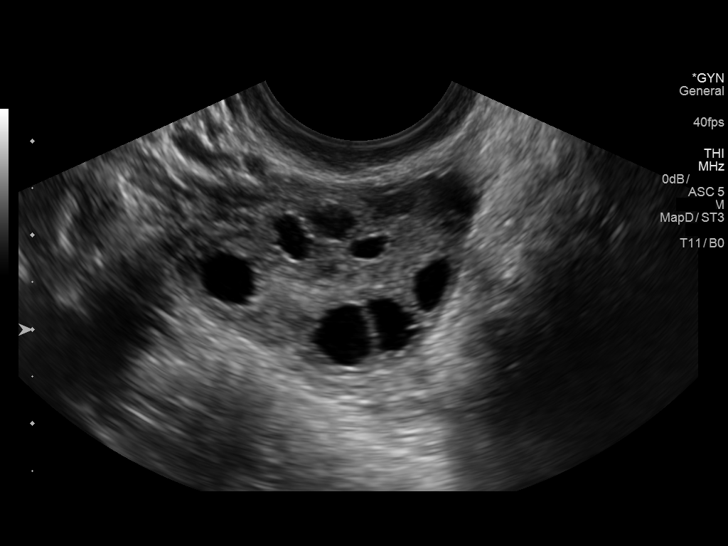
[im 48/72]
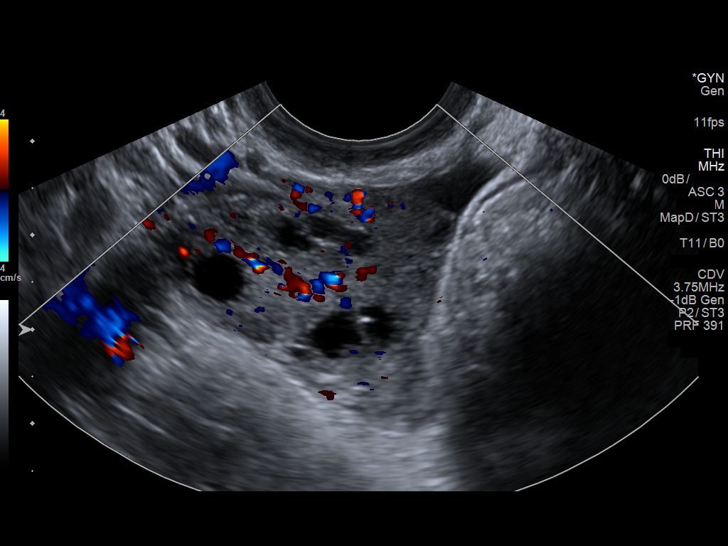
[im 54/72]
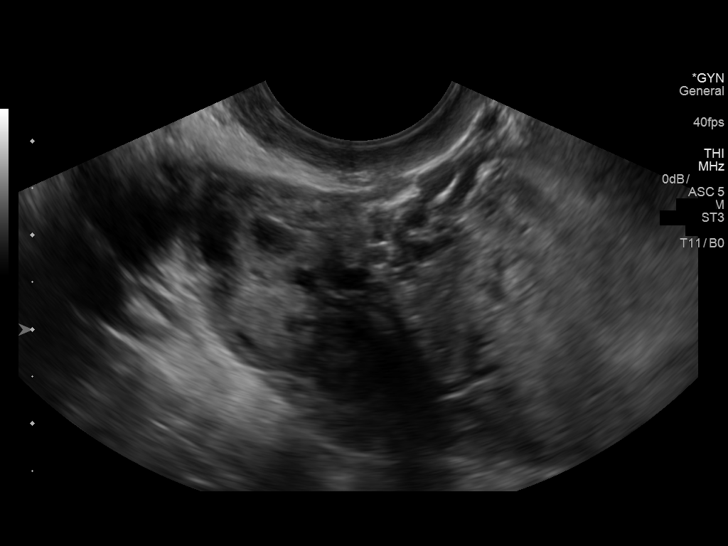
[im 60/72]
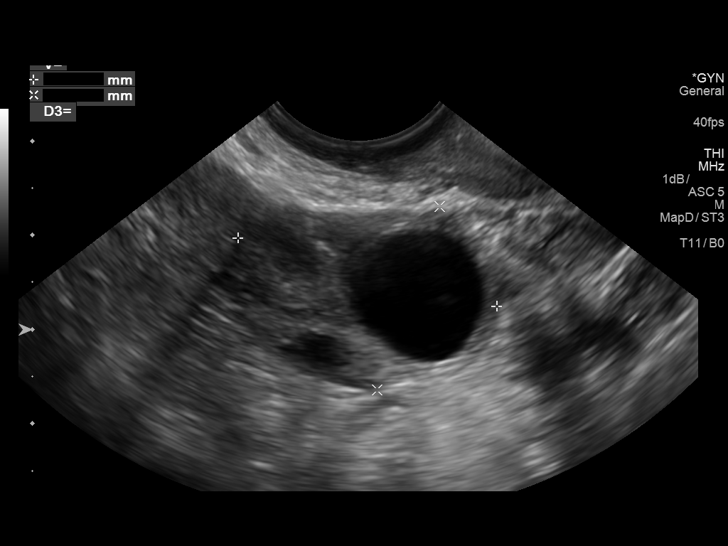
[im 66/72]
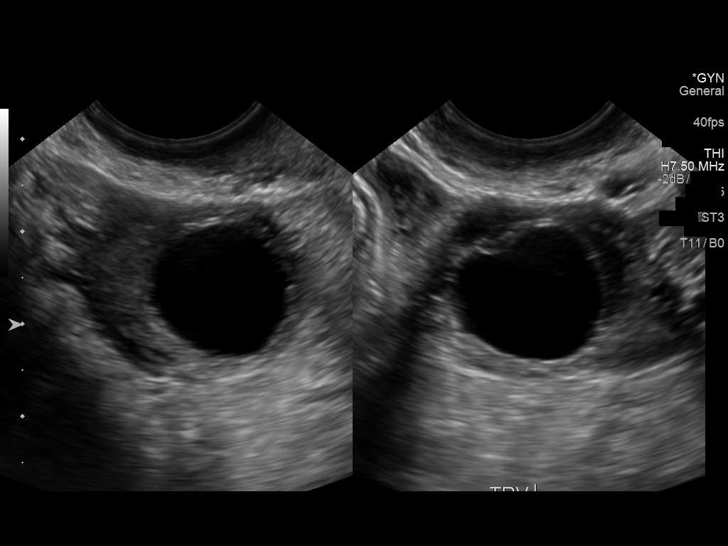
[im 72/72]
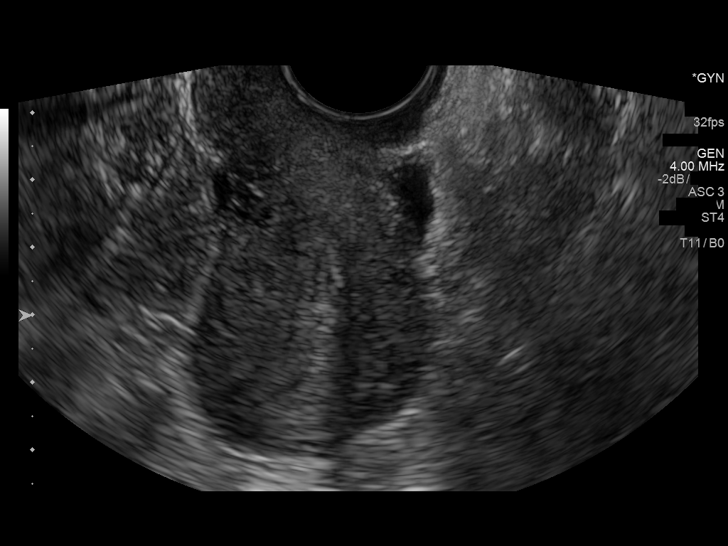

[14 of 25 positions shown; findings below may reference images not displayed]

FINDINGS: Uterus

Measurements: 7.0 x 3.2 x 4.0 cm = volume: 48 mL. No fibroids or
other mass visualized.

Endometrium

Thickness: 6 mm.  No focal abnormality visualized.

Right ovary

Measurements: 3.3 x 1.9 x 2.1 cm = volume: 7 mL. Normal
appearance/no adnexal mass.

Left ovary

Measurements: 2.8 x 2.1 x 2.4 cm = volume: 7 mL. Normal
appearance/no adnexal mass.

Other findings

Trace free fluid, likely physiologic.
IMPRESSION: Negative pelvic ultrasound.

## 2020-11-16 DIAGNOSIS — F17210 Nicotine dependence, cigarettes, uncomplicated: Secondary | ICD-10-CM | POA: Diagnosis not present

## 2020-11-16 DIAGNOSIS — M545 Low back pain, unspecified: Secondary | ICD-10-CM | POA: Diagnosis not present

## 2020-11-16 DIAGNOSIS — M5489 Other dorsalgia: Secondary | ICD-10-CM | POA: Diagnosis not present

## 2020-12-08 DIAGNOSIS — M545 Low back pain, unspecified: Secondary | ICD-10-CM | POA: Diagnosis not present

## 2020-12-30 DIAGNOSIS — M545 Low back pain, unspecified: Secondary | ICD-10-CM | POA: Diagnosis not present

## 2021-01-10 ENCOUNTER — Telehealth: Payer: BC Managed Care – PPO | Admitting: Family

## 2021-01-10 DIAGNOSIS — R109 Unspecified abdominal pain: Secondary | ICD-10-CM

## 2021-01-10 DIAGNOSIS — R319 Hematuria, unspecified: Secondary | ICD-10-CM

## 2021-01-10 DIAGNOSIS — R300 Dysuria: Secondary | ICD-10-CM

## 2021-01-10 NOTE — Progress Notes (Signed)
Based on what you shared with me, I feel your condition warrants further evaluation and I recommend that you be seen in a face to face visit.  Given your current symptoms, you need to be seen face to face to rule out a kidney stone or a more serious infection.    NOTE: There will be NO CHARGE for this eVisit   If you are having a true medical emergency please call 911.      For an urgent face to face visit, [place] has six urgent care centers for your convenience:     [facility] Center at [facility] Directions [phone] [address] [place], [place] [address]    [facility] Health Urgent Care Center [facility]) Get Driving Directions [phone] [address] [place], [place] [address]  [facility] Health Urgent Care Center [facility]) Get Driving Directions [phone] [address] [place],  [place]  [address]  [facility] Health Urgent Care at [facility] Get Driving Directions [phone] [address] [place], [place] [address]   [facility] Health Urgent Care at [facility] Get Driving Directions  [phone] [address] [place], [place] [address]   [facility] Health Urgent Care at [facility] Directions [phone] [address], [address] [place], [place] [address]  Your MyChart E-visit questionnaire answers were reviewed by a board certified advanced clinical practitioner to complete your personal care plan based on your specific symptoms.  Thank you for using e-Visits.

## 2021-01-12 ENCOUNTER — Telehealth: Payer: BC Managed Care – PPO | Admitting: Family Medicine

## 2021-01-12 DIAGNOSIS — N898 Other specified noninflammatory disorders of vagina: Secondary | ICD-10-CM

## 2021-01-12 MED ORDER — FLUCONAZOLE 150 MG PO TABS: 150.0000 mg | ORAL_TABLET | Freq: Once | ORAL | 0 refills | Status: AC

## 2021-01-12 MED ORDER — METRONIDAZOLE 500 MG PO TABS
500.0000 mg | ORAL_TABLET | Freq: Three times a day (TID) | ORAL | 0 refills | Status: AC
Start: 2021-01-12 — End: 2021-01-19

## 2021-01-12 NOTE — Addendum Note (Signed)
Addended by: Freddy Finner on: 01/12/2021 03:06 PM   Modules accepted: Orders, Level of Service

## 2021-01-12 NOTE — Addendum Note (Signed)
Addended by: Freddy Finner on: 01/12/2021 03:08 PM   Modules accepted: Level of Service

## 2021-01-12 NOTE — Progress Notes (Addendum)
E-Visit for Vaginal Symptoms  We are sorry that you are not feeling well. Here is how we plan to help! Based on what you shared with me it looks like you: May have a vaginosis due to bacteria and yeast  Vaginosis is an inflammation of the vagina that can result in discharge, itching and pain. The cause is usually a change in the normal balance of vaginal bacteria or an infection. Vaginosis can also result from reduced estrogen levels after menopause.  The most common causes of vaginosis are:   Bacterial vaginosis which results from an overgrowth of one on several organisms that are normally present in your vagina.   Yeast infections which are caused by a naturally occurring fungus called candida.   Vaginal atrophy (atrophic vaginosis) which results from the thinning of the vagina from reduced estrogen levels after menopause.   Trichomoniasis which is caused by a parasite and is commonly transmitted by sexual intercourse.  Factors that increase your risk of developing vaginosis include: Medications, such as antibiotics and steroids Uncontrolled diabetes Use of hygiene products such as bubble bath, vaginal spray or vaginal deodorant Douching Wearing damp or tight-fitting clothing Using an intrauterine device (IUD) for birth control Hormonal changes, such as those associated with pregnancy, birth control pills or menopause Sexual activity Having a sexually transmitted infection  Your treatment plan is Metronidazole or Flagyl 500mg  twice a day for 7 days.  I have electronically sent this prescription into the pharmacy that you have chosen. And Diflucan to take after you finish the medication you are on  Be sure to take all of the medication as directed. Stop taking any medication if you develop a rash, tongue swelling or shortness of breath. Mothers who are breast feeding should consider pumping and discarding their breast milk while on these antibiotics. However, there is no consensus  that infant exposure at these doses would be harmful.  Remember that medication creams can weaken latex condoms.   HOME CARE:  Good hygiene may prevent some types of vaginosis from recurring and may relieve some symptoms:  Avoid baths, hot tubs and whirlpool spas. Rinse soap from your outer genital area after a shower, and dry the area well to prevent irritation. Don't use scented or harsh soaps, such as those with deodorant or antibacterial action. Avoid irritants. These include scented tampons and pads. Wipe from front to back after using the toilet. Doing so avoids spreading fecal bacteria to your vagina.  Other things that may help prevent vaginosis include:  Don't douche. Your vagina doesn't require cleansing other than normal bathing. Repetitive douching disrupts the normal organisms that reside in the vagina and can actually increase your risk of vaginal infection. Douching won't clear up a vaginal infection. Use a latex condom. Both female and female latex condoms may help you avoid infections spread by sexual contact. Wear cotton underwear. Also wear pantyhose with a cotton crotch. If you feel comfortable without it, skip wearing underwear to bed. Yeast thrives in [place] Your symptoms should improve in the next day or two.  GET HELP RIGHT AWAY IF:  You have pain in your lower abdomen ( pelvic area or over your ovaries) You develop nausea or vomiting You develop a fever Your discharge changes or worsens You have persistent pain with intercourse You develop shortness of breath, a rapid pulse, or you faint.  These symptoms could be signs of problems or infections that need to be evaluated by a medical provider now.  MAKE SURE  YOU   Understand these instructions. Will watch your condition. Will get help right away if you are not doing well or get worse.  Thank you for choosing an e-visit.  Your e-visit answers were reviewed by a board certified advanced  clinical practitioner to complete your personal care plan. Depending upon the condition, your plan could have included both over the counter or prescription medications.  Please review your pharmacy choice. Make sure the pharmacy is open so you can pick up prescription now. If there is a problem, you may contact your provider through [employer] and have the prescription routed to another pharmacy.  Your safety is important to [place]. If you have drug allergies check your prescription carefully.   For the next 24 hours you can use MyChart to ask questions about today's visit, request a non-urgent call back, or ask for a work or school excuse. You will get an email in the next two days asking about your experience. I hope that your e-visit has been valuable and will speed your recovery.  I provided 5 minutes of non face-to-face time during this encounter for chart review, medication and order placement, as well as and documentation.

## 2021-02-01 DIAGNOSIS — M545 Low back pain, unspecified: Secondary | ICD-10-CM | POA: Diagnosis not present

## 2021-02-01 DIAGNOSIS — M47816 Spondylosis without myelopathy or radiculopathy, lumbar region: Secondary | ICD-10-CM | POA: Diagnosis not present

## 2023-06-15 ENCOUNTER — Encounter: Payer: Self-pay | Admitting: Family Medicine

## 2023-06-15 ENCOUNTER — Ambulatory Visit
Admission: EM | Admit: 2023-06-15 | Discharge: 2023-06-15 | Disposition: A | Discharge status: Home / Self Care | Attending: Family Medicine | Admitting: Family Medicine

## 2023-06-15 DIAGNOSIS — Z202 Contact with and (suspected) exposure to infections with a predominantly sexual mode of transmission: Secondary | ICD-10-CM | POA: Diagnosis not present

## 2023-06-15 DIAGNOSIS — N898 Other specified noninflammatory disorders of vagina: Secondary | ICD-10-CM | POA: Diagnosis not present

## 2023-06-15 DIAGNOSIS — J069 Acute upper respiratory infection, unspecified: Secondary | ICD-10-CM | POA: Diagnosis not present

## 2023-06-15 DIAGNOSIS — R059 Cough, unspecified: Secondary | ICD-10-CM | POA: Diagnosis not present

## 2023-06-15 LAB — POCT URINALYSIS DIP (MANUAL ENTRY)
Glucose, UA: NEGATIVE mg/dL
Ketones, POC UA: NEGATIVE mg/dL
Leukocytes, UA: NEGATIVE
Nitrite, UA: NEGATIVE
Protein Ur, POC: 100 mg/dL — AB
Spec Grav, UA: >=1.03 — AB (ref 1.010–1.025)
Urobilinogen, UA: 0.2 U/dL
pH, UA: 6 (ref 5.0–8.0)

## 2023-06-15 MED ORDER — AMOXICILLIN-POT CLAVULANATE 875-125 MG PO TABS: 1.0000 | ORAL_TABLET | Freq: Two times a day (BID) | ORAL | 0 refills | Status: DC

## 2023-06-15 MED ORDER — HYDROCODONE BIT-HOMATROP MBR 5-1.5 MG/5ML PO SOLN: 5.0000 mL | Freq: Four times a day (QID) | ORAL | 0 refills | Status: DC | PRN

## 2023-06-15 MED ORDER — PREDNISONE 20 MG PO TABS: ORAL_TABLET | ORAL | 0 refills | Status: DC

## 2023-06-15 NOTE — Discharge Instructions (Addendum)
 Advised patient take medications as directed with food to completion.  Advised patient to take prednisone with first dose of Augmentin for the next 5 of 7 days.  Advised may take Hycodan cough syrup at night prior to sleep for cough due to sedative effects.  Encouraged to increase daily water intake to 64 ounces per day 7 days/week.  Advised we will follow-up with Aptima swab results once received.  Advised if symptoms worsen and/or unresolved please follow-up with your PCP or here for further evaluation.

## 2023-06-15 NOTE — ED Provider Notes (Signed)
 [facility]    CSN: [id number] Arrival date & time: 06/15/23  1347      History   Chief Complaint Chief Complaint  Patient presents with   Cough   Nasal Congestion   Wheezing   Exposure to STD    possible    HPI Christina Munoz is a 23 y.o. female.   HPI pleasant 23 year old female presents with nasal congestion, cough and wheezing for 10 days.  Patient is also concerned with possibility of STD given recent infidelity in her relationship.  PMH significant for acute pancreatitis, PUD, and history of lumbar degenerative disc disease  Past Medical History:  Diagnosis Date   History of acute pancreatitis    History of gallstones    Hx of migraine headaches    Lumbar degenerative disc disease 01/23/2018   PUD (peptic ulcer disease)    Suicidal ideations 01/03/2018    Patient Active Problem List   Diagnosis Date Noted   Irritable bowel syndrome with both constipation and diarrhea 05/09/2018   Insomnia secondary to anxiety 05/09/2018   Sinus tachycardia by electrocardiogram 03/09/2018   Palpitations 03/09/2018   Generalized abdominal pain 03/05/2018   GAD (generalized anxiety disorder) 03/05/2018   Pelvic pain in female 01/30/2018   Dyspareunia in female 01/30/2018   Constipation 01/23/2018   Lumbar degenerative disc disease 01/23/2018   EBV infection 01/23/2018   Renal cyst, right 01/19/2018   Bilirubinuria 01/18/2018   Left axillary pain 01/11/2018   Encounter for counseling regarding contraception 01/11/2018   History of chlamydia infection 01/10/2018   History of migraine with aura 01/03/2018   Anxiety with depression 01/03/2018   Nicotine dependence 01/03/2018   History of acute pancreatitis     Past Surgical History:  Procedure Laterality Date   CHOLECYSTECTOMY     ESOPHAGOGASTRODUODENOSCOPY     WISDOM TOOTH EXTRACTION      OB History     Gravida  0   Para  0   Term  0   Preterm  0   AB  0   Living  0      SAB  0   IAB  0    Ectopic  0   Multiple  0   Live Births  0            Home Medications    Prior to Admission medications   Medication Sig Start Date End Date Taking? Authorizing Provider  amoxicillin-clavulanate (AUGMENTIN) 875-125 MG tablet Take 1 tablet by mouth every 12 (twelve) hours. 06/15/23  Yes Trevor Iha, FNP  amphetamine-dextroamphetamine (ADDERALL) 15 MG tablet Take by mouth. 10/20/20  Yes [provider]  cholestyramine (QUESTRAN) 4 g packet MIX AND DRINK 1 PACKET(4 GRAMS) BY MOUTH DAILY 05/15/23  Yes [provider]  HYDROcodone bit-homatropine (HYCODAN) 5-1.5 MG/5ML syrup Take 5 mLs by mouth every 6 (six) hours as needed for cough. 06/15/23  Yes Trevor Iha, FNP  predniSONE (DELTASONE) 20 MG tablet Take 3 tabs PO daily x 5 days. 06/15/23  Yes Trevor Iha, FNP  EPINEPHrine 0.3 mg/0.3 mL IJ SOAJ injection INJECT 0.3 ML INTO THE MUSCLE ONCE FOR 1 DOSE 06/30/17   [provider]  metoprolol succinate (TOPROL-XL) 25 MG 24 hr tablet TAKE 1 TABLET BY MOUTH EVERY DAY 08/28/19   Monica Becton, MD  pantoprazole (PROTONIX) 40 MG tablet Take by mouth. 10/12/17   [provider]  DULoxetine (CYMBALTA) 60 MG capsule TAKE 1 CAPSULE (60 MG TOTAL) BY MOUTH AT BEDTIME. 07/31/18 02/21/19  Carlis Stable, PA-C  hyoscyamine (LEVSIN) 0.125 MG tablet Take 1 tablet (0.125 mg total) by mouth every 4 (four) hours as needed for cramping (abdominal pain). 06/21/18 02/21/19  Carlis Stable, PA-C  traZODone (DESYREL) 50 MG tablet TAKE 0.5-1 TABLETS (25-50 MG TOTAL) BY MOUTH AT BEDTIME AS NEEDED FOR SLEEP. 07/31/18 02/21/19  Carlis Stable, PA-C    Family History Family History  Problem Relation Age of Onset   Breast cancer Paternal Grandmother    Heart disease Maternal Grandmother    Depression Maternal Grandmother    Depression Mother     Social History Social History   Tobacco Use   Smoking status: Never   Smokeless  tobacco: Never  Vaping Use   Vaping status: Every Day  Substance Use Topics   Alcohol use: No   Drug use: Yes    Types: Marijuana     Allergies   Pineapple, Strawberry extract, Gabapentin, and Imipramine   Review of Systems Review of Systems  Respiratory:  Positive for cough and wheezing.   Genitourinary:  Positive for vaginal discharge.  All other systems reviewed and are negative.    Physical Exam Triage Vital Signs ED Triage Vitals [06/15/23 1400]  Encounter Vitals Group     BP      Systolic BP Percentile      Diastolic BP Percentile      Pulse      Resp      Temp      Temp src      SpO2      Weight      Height      Head Circumference      Peak Flow      Pain Score 0     Pain Loc      Pain Education      Exclude from Growth Chart    No data found.  Updated Vital Signs BP 129/76 (BP Location: Right Arm)   Pulse (!) 123   Temp 99.3 F (37.4 C) (Oral)   Resp 17   LMP 06/04/2023 (Exact Date)   SpO2 99%      Physical Exam Vitals and nursing note reviewed.  Constitutional:      Appearance: Normal appearance. She is normal weight. She is ill-appearing.  HENT:     Head: Normocephalic and atraumatic.     Right Ear: Tympanic membrane, ear canal and external ear normal.     Left Ear: Tympanic membrane, ear canal and external ear normal.     Mouth/Throat:     Mouth: Mucous membranes are moist.     Pharynx: Oropharynx is clear.  Eyes:     Extraocular Movements: Extraocular movements intact.     Conjunctiva/sclera: Conjunctivae normal.     Pupils: Pupils are equal, round, and reactive to light.  Cardiovascular:     Rate and Rhythm: Normal rate and regular rhythm.     Pulses: Normal pulses.     Heart sounds: Normal heart sounds.  Pulmonary:     Effort: Pulmonary effort is normal.     Breath sounds: Normal breath sounds. No wheezing, rhonchi or rales.     Comments: Infrequent nonproductive cough on exam Chest:     Chest wall: No tenderness.   Musculoskeletal:        General: Normal range of motion.     Cervical back: Normal range of motion and neck supple.  Skin:    General: Skin is warm and dry.  Neurological:  General: No focal deficit present.     Mental Status: She is alert and oriented to person, place, and time. Mental status is at baseline.  Psychiatric:        Mood and Affect: Mood normal.        Behavior: Behavior normal.        Thought Content: Thought content normal.      UC Treatments / Results  Labs (all labs ordered are listed, but only abnormal results are displayed) Labs Reviewed  POCT URINALYSIS DIP (MANUAL ENTRY) - Abnormal; Notable for the following components:      Result Value   Bilirubin, UA small (*)    Spec Grav, UA >=1.030 (*)    Blood, UA moderate (*)    Protein Ur, POC =100 (*)    All other components within normal limits  CERVICOVAGINAL ANCILLARY ONLY    EKG   Radiology No results found.  Procedures Procedures (including critical care time)  Medications Ordered in UC Medications - No data to display  Initial Impression / Assessment and Plan / UC Course  I have reviewed the triage vital signs and the nursing notes.  Pertinent labs & imaging results that were available during my care of the patient were reviewed by me and considered in my medical decision making (see chart for details).     MDM: 1.  Acute URI-Rx'd Augmentin 875/125 mg tablet: Take 1 tablet twice daily x 7 days; 2.  Cough, unspecified type-Rx'd prednisone 20 mg tablet: Take 3 tablets p.o. daily x 5 days, Rx'd Hycodan 5-1.5 Mg/5 mL syrup: Take 5 mL every 6 hours as needed for cough; 3.  Potential exposure to STD-Aptima swab ordered; 4.  Vaginal discharge-concern for STD, Aptima swab ordered. Advised patient to take prednisone with first dose of Augmentin for the next 5 of 7 days.  Advised may take Hycodan cough syrup at night prior to sleep for cough due to sedative effects.  Encouraged to increase daily water  intake to 64 ounces per day 7 days/week.  Advised we will follow-up with Aptima swab results once received.  Advised if symptoms worsen and/or unresolved please follow-up with your PCP or here for further evaluation. Final Clinical Impressions(s) / UC Diagnoses   Final diagnoses:  Vaginal discharge  Cough, unspecified type  Potential exposure to STD  Acute URI     Discharge Instructions      Advised patient take medications as directed with food to completion.  Advised patient to take prednisone with first dose of Augmentin for the next 5 of 7 days.  Advised may take Hycodan cough syrup at night prior to sleep for cough due to sedative effects.  Encouraged to increase daily water intake to 64 ounces per day 7 days/week.  Advised we will follow-up with Aptima swab results once received.  Advised if symptoms worsen and/or unresolved please follow-up with your PCP or here for further evaluation.     ED Prescriptions     Medication Sig Dispense Auth. Provider   amoxicillin-clavulanate (AUGMENTIN) 875-125 MG tablet Take 1 tablet by mouth every 12 (twelve) hours. 14 tablet Trevor Iha, FNP   predniSONE (DELTASONE) 20 MG tablet Take 3 tabs PO daily x 5 days. 15 tablet Trevor Iha, FNP   HYDROcodone bit-homatropine (HYCODAN) 5-1.5 MG/5ML syrup Take 5 mLs by mouth every 6 (six) hours as needed for cough. 120 mL Trevor Iha, FNP      I have reviewed the PDMP during this encounter.   Trevor Iha,  FNP 06/15/23 1449

## 2023-06-15 NOTE — ED Triage Notes (Addendum)
 Pt c/o cough, congestion and wheezing x 10 days. Fever last week which has resolved. Mucinex and tylenol prn.   Would also like STD testing due to partner infidelity. Some vaginal discharge and dysuria starting today.

## 2023-06-20 ENCOUNTER — Telehealth (HOSPITAL_COMMUNITY): Payer: Self-pay

## 2023-06-20 LAB — CERVICOVAGINAL ANCILLARY ONLY
Bacterial Vaginitis (gardnerella): POSITIVE — AB
Candida Glabrata: NEGATIVE
Candida Vaginitis: NEGATIVE
Chlamydia: NEGATIVE
Comment: NEGATIVE
Comment: NEGATIVE
Comment: NEGATIVE
Comment: NEGATIVE
Comment: NEGATIVE
Comment: NORMAL
Neisseria Gonorrhea: NEGATIVE
Trichomonas: NEGATIVE

## 2023-06-20 MED ORDER — METRONIDAZOLE 500 MG PO TABS: 500.0000 mg | ORAL_TABLET | Freq: Two times a day (BID) | ORAL | 0 refills | Status: AC

## 2023-06-20 NOTE — Telephone Encounter (Signed)
 Per protocol, pt requires tx with metronidazole. Rx sent to pharmacy on file.

## 2023-09-25 ENCOUNTER — Ambulatory Visit
Admission: EM | Admit: 2023-09-25 | Discharge: 2023-09-25 | Disposition: A | Discharge status: Home / Self Care | Attending: Family Medicine | Admitting: Family Medicine

## 2023-09-25 ENCOUNTER — Ambulatory Visit

## 2023-09-25 ENCOUNTER — Encounter: Payer: Self-pay | Admitting: Emergency Medicine

## 2023-09-25 DIAGNOSIS — M79641 Pain in right hand: Secondary | ICD-10-CM | POA: Diagnosis not present

## 2023-09-25 DIAGNOSIS — S60221A Contusion of right hand, initial encounter: Secondary | ICD-10-CM

## 2023-09-25 DIAGNOSIS — S60921A Unspecified superficial injury of right hand, initial encounter: Secondary | ICD-10-CM

## 2023-09-25 NOTE — Discharge Instructions (Addendum)
 Advised patient of right hand x-ray results with hardcopy and imaging provided.  Advised patient to RICE affected area of right hand for 30 minutes 3 times daily for the next 3 days.  Advised patient may take OTC Ibuprofen 600 mg daily, as needed for right hand pain.  Advised if symptoms worsen and/or unresolved please follow-up with your PCP or [facility].  Contact information provided with his AVS today.

## 2023-09-25 NOTE — ED Triage Notes (Signed)
 Patient states that she started a new medication recently.  While on the medication, she became extremely angry and punched a wall injuring her right hand.  The hand is bruised.  Denies any otc pain meds.

## 2023-09-25 NOTE — ED Provider Notes (Signed)
 [facility]    CSN: [id number] Arrival date & time: 09/25/23  1901      History   Chief Complaint Chief Complaint  Patient presents with   Hand Injury    HPI Christina Munoz is a 23 y.o. female.   HPI 23 year old female presents with right hand pain.  Patient reports started new medication recently while on medication she became extremely angry and punching a wall with the right hand yesterday, Sunday, 09/24/2023.  Past Medical History:  Diagnosis Date   History of acute pancreatitis    History of gallstones    Hx of migraine headaches    Lumbar degenerative disc disease 01/23/2018   PUD (peptic ulcer disease)    Suicidal ideations 01/03/2018    Patient Active Problem List   Diagnosis Date Noted   Irritable bowel syndrome with both constipation and diarrhea 05/09/2018   Insomnia secondary to anxiety 05/09/2018   Sinus tachycardia by electrocardiogram 03/09/2018   Palpitations 03/09/2018   Generalized abdominal pain 03/05/2018   GAD (generalized anxiety disorder) 03/05/2018   Pelvic pain in female 01/30/2018   Dyspareunia in female 01/30/2018   Constipation 01/23/2018   Lumbar degenerative disc disease 01/23/2018   EBV infection 01/23/2018   Renal cyst, right 01/19/2018   Bilirubinuria 01/18/2018   Left axillary pain 01/11/2018   Encounter for counseling regarding contraception 01/11/2018   History of chlamydia infection 01/10/2018   History of migraine with aura 01/03/2018   Anxiety with depression 01/03/2018   Nicotine dependence 01/03/2018   History of acute pancreatitis     Past Surgical History:  Procedure Laterality Date   CHOLECYSTECTOMY     ESOPHAGOGASTRODUODENOSCOPY     WISDOM TOOTH EXTRACTION      OB History     Gravida  0   Para  0   Term  0   Preterm  0   AB  0   Living  0      SAB  0   IAB  0   Ectopic  0   Multiple  0   Live Births  0            Home Medications    Prior to Admission medications    Medication Sig Start Date End Date Taking? Authorizing Provider  amphetamine-dextroamphetamine (ADDERALL) 15 MG tablet Take by mouth. 10/20/20  Yes [provider]  cholestyramine (QUESTRAN) 4 g packet MIX AND DRINK 1 PACKET(4 GRAMS) BY MOUTH DAILY 05/15/23  Yes [provider]  EPINEPHrine  0.3 mg/0.3 mL IJ SOAJ injection INJECT 0.3 ML INTO THE MUSCLE ONCE FOR 1 DOSE 06/30/17  Yes [provider]  metoprolol  succinate (TOPROL -XL) 25 MG 24 hr tablet TAKE 1 TABLET BY MOUTH EVERY DAY 08/28/19  Yes Curtis Debby PARAS, MD  pantoprazole (PROTONIX) 40 MG tablet Take by mouth. 10/12/17  Yes [provider]  amoxicillin -clavulanate (AUGMENTIN ) 875-125 MG tablet Take 1 tablet by mouth every 12 (twelve) hours. 06/15/23   Alyssa Rotondo, FNP  HYDROcodone  bit-homatropine (HYCODAN) 5-1.5 MG/5ML syrup Take 5 mLs by mouth every 6 (six) hours as needed for cough. 06/15/23   Teddy Sharper, FNP  predniSONE  (DELTASONE ) 20 MG tablet Take 3 tabs PO daily x 5 days. 06/15/23   Lendell Gallick, FNP  DULoxetine  (CYMBALTA ) 60 MG capsule TAKE 1 CAPSULE (60 MG TOTAL) BY MOUTH AT BEDTIME. 07/31/18 02/21/19  Tommas Severa Norris, PA-C  hyoscyamine  (LEVSIN ) 0.125 MG tablet Take 1 tablet (0.125 mg total) by mouth every 4 (four) hours as needed  for cramping (abdominal pain). 06/21/18 02/21/19  Tommas Severa Norris, PA-C  traZODone  (DESYREL ) 50 MG tablet TAKE 0.5-1 TABLETS (25-50 MG TOTAL) BY MOUTH AT BEDTIME AS NEEDED FOR SLEEP. 07/31/18 02/21/19  Tommas Severa Norris, PA-C    Family History Family History  Problem Relation Age of Onset   Depression Mother    Heart disease Maternal Grandmother    Depression Maternal Grandmother    Breast cancer Paternal Grandmother     Social History Social History   Tobacco Use   Smoking status: Never   Smokeless tobacco: Never  Vaping Use   Vaping status: Every Day  Substance Use Topics   Alcohol use: No   Drug use: Yes    Types:  Marijuana     Allergies   Pineapple, Strawberry extract, Gabapentin, and Imipramine    Review of Systems Review of Systems   Physical Exam Triage Vital Signs ED Triage Vitals  Encounter Vitals Group     BP      Girls Systolic BP Percentile      Girls Diastolic BP Percentile      Boys Systolic BP Percentile      Boys Diastolic BP Percentile      Pulse      Resp      Temp      Temp src      SpO2      Weight      Height      Head Circumference      Peak Flow      Pain Score      Pain Loc      Pain Education      Exclude from Growth Chart    No data found.  Updated Vital Signs BP 111/77 (BP Location: Right Arm)   Pulse 99   Temp 100 F (37.8 C) (Oral)   Resp 18   Ht 5' 4 (1.626 m)   Wt 120 lb (54.4 kg)   LMP 09/02/2023   SpO2 99%   BMI 20.60 kg/m    Physical Exam Vitals and nursing note reviewed.  Constitutional:      Appearance: Normal appearance. She is normal weight.  HENT:     Head: Normocephalic and atraumatic.     Mouth/Throat:     Mouth: Mucous membranes are moist.     Pharynx: Oropharynx is clear.  Eyes:     Extraocular Movements: Extraocular movements intact.     Pupils: Pupils are equal, round, and reactive to light.  Cardiovascular:     Rate and Rhythm: Normal rate and regular rhythm.     Pulses: Normal pulses.     Heart sounds: Normal heart sounds.  Pulmonary:     Effort: Pulmonary effort is normal.     Breath sounds: Normal breath sounds. No wheezing, rhonchi or rales.  Musculoskeletal:        General: Normal range of motion.     Comments: Right hand: TTP over dorsum (fifth metacarpal head), grip is 5/5, neurovascular/neurosensory intact  Skin:    General: Skin is warm and dry.  Neurological:     General: No focal deficit present.     Mental Status: She is alert and oriented to person, place, and time.      UC Treatments / Results  Labs (all labs ordered are listed, but only abnormal results are displayed) Labs Reviewed -  No data to display  EKG   Radiology DG Hand Complete Right Result Date: 09/25/2023 CLINICAL DATA:  Blunt trauma  EXAM: RIGHT HAND - COMPLETE 3+ VIEW COMPARISON:  None Available. FINDINGS: No evidence of fracture of the carpal or metacarpal bones. Radiocarpal joint is intact. Phalanges are normal. No soft tissue injury. IMPRESSION: No fracture or dislocation. Electronically Signed   By: Jackquline Boxer M.D.   On: 09/25/2023 19:39    Procedures Procedures (including critical care time)  Medications Ordered in UC Medications - No data to display  Initial Impression / Assessment and Plan / UC Course  I have reviewed the triage vital signs and the nursing notes.  Pertinent labs & imaging results that were available during my care of the patient were reviewed by me and considered in my medical decision making (see chart for details).     MDM: 1.  Right hand pain-right hand x-ray results revealed above; 2.  Contusion of right hand, initial encounter-Advised patient of right hand x-ray results with hardcopy and imaging provided.  Advised patient to RICE affected area of right hand for 30 minutes 3 times daily for the next 3 days.  Advised patient may take OTC Ibuprofen 600 mg daily, as needed for right hand pain.  Advised if symptoms worsen and/or unresolved please follow-up with your PCP or [facility].  Contact information provided with his AVS today.  Patient discharged home, hemodynamically stable. Final Clinical Impressions(s) / UC Diagnoses   Final diagnoses:  Right hand pain  Contusion of right hand, initial encounter     Discharge Instructions      Advised patient of right hand x-ray results with hardcopy and imaging provided.  Advised patient to RICE affected area of right hand for 30 minutes 3 times daily for the next 3 days.  Advised patient may take OTC Ibuprofen 600 mg daily, as needed for right hand pain.  Advised if symptoms worsen and/or unresolved please  follow-up with your PCP or [facility].  Contact information provided with his AVS today.     ED Prescriptions   None    PDMP not reviewed this encounter.   Teddy Sharper, FNP 09/25/23 2006

## 2023-10-24 ENCOUNTER — Ambulatory Visit
Admission: EM | Admit: 2023-10-24 | Discharge: 2023-10-24 | Disposition: A | Discharge status: Home / Self Care | Attending: Family Medicine | Admitting: Family Medicine

## 2023-10-24 ENCOUNTER — Encounter: Payer: Self-pay | Admitting: *Deleted

## 2023-10-24 DIAGNOSIS — Z23 Encounter for immunization: Secondary | ICD-10-CM | POA: Diagnosis not present

## 2023-10-24 DIAGNOSIS — S6991XA Unspecified injury of right wrist, hand and finger(s), initial encounter: Secondary | ICD-10-CM

## 2023-10-24 DIAGNOSIS — W5501XA Bitten by cat, initial encounter: Secondary | ICD-10-CM

## 2023-10-24 DIAGNOSIS — S61031A Puncture wound without foreign body of right thumb without damage to nail, initial encounter: Secondary | ICD-10-CM

## 2023-10-24 MED ORDER — TETANUS-DIPHTH-ACELL PERTUSSIS 5-2.5-18.5 LF-MCG/0.5 IM SUSY
0.5000 mL | PREFILLED_SYRINGE | Freq: Once | INTRAMUSCULAR | Status: AC
  Administered 2023-10-24: 0.5 mL via INTRAMUSCULAR

## 2023-10-24 NOTE — ED Notes (Signed)
 Right thumb wound cleansed with soap and water.

## 2023-10-24 NOTE — ED Triage Notes (Addendum)
 Pt states her cat was choking on a hair ball last night, and was unable to bring it up, so she dug into its mouth, causing tiny lacerations to right thumb. Today right thumb swollen, and pt wishes to have tetanus booster. Right thumb CMS intact. Pt states cat is up to date on vaccinations.

## 2023-10-24 NOTE — ED Provider Notes (Signed)
 [facility]    CSN: [id number] Arrival date & time: 10/24/23  1745      History   Chief Complaint Chief Complaint  Patient presents with   Wound Check    thumb    HPI Rue Valladares is a 23 y.o. female.   HPI 23 year old female presents with right thumb bite wound from her cat.  Patient reports Was choking on a hairball and she stuck her thumb down her cat's throat to assist with choking.  Patient reports incident left tiny lacerations on PMH significant for acute pancreatitis, migraines, and PUD.  Past Medical History:  Diagnosis Date   History of acute pancreatitis    History of gallstones    Hx of migraine headaches    Lumbar degenerative disc disease 01/23/2018   PUD (peptic ulcer disease)    Suicidal ideations 01/03/2018    Patient Active Problem List   Diagnosis Date Noted   Irritable bowel syndrome with both constipation and diarrhea 05/09/2018   Insomnia secondary to anxiety 05/09/2018   Sinus tachycardia by electrocardiogram 03/09/2018   Palpitations 03/09/2018   Generalized abdominal pain 03/05/2018   GAD (generalized anxiety disorder) 03/05/2018   Pelvic pain in female 01/30/2018   Dyspareunia in female 01/30/2018   Constipation 01/23/2018   Lumbar degenerative disc disease 01/23/2018   EBV infection 01/23/2018   Renal cyst, right 01/19/2018   Bilirubinuria 01/18/2018   Left axillary pain 01/11/2018   Encounter for counseling regarding contraception 01/11/2018   History of chlamydia infection 01/10/2018   History of migraine with aura 01/03/2018   Anxiety with depression 01/03/2018   Nicotine dependence 01/03/2018   History of acute pancreatitis     Past Surgical History:  Procedure Laterality Date   CHOLECYSTECTOMY     ESOPHAGOGASTRODUODENOSCOPY     WISDOM TOOTH EXTRACTION      OB History     Gravida  0   Para  0   Term  0   Preterm  0   AB  0   Living  0      SAB  0   IAB  0   Ectopic  0   Multiple  0   Live  Births  0            Home Medications    Prior to Admission medications   Medication Sig Start Date End Date Taking? Authorizing Provider  amphetamine-dextroamphetamine (ADDERALL) 15 MG tablet Take by mouth. 10/20/20  Yes [provider]  amoxicillin -clavulanate (AUGMENTIN ) 875-125 MG tablet Take 1 tablet by mouth every 12 (twelve) hours. 06/15/23   Jazzma Neidhardt, FNP  cholestyramine (QUESTRAN) 4 g packet MIX AND DRINK 1 PACKET(4 GRAMS) BY MOUTH DAILY 05/15/23   [provider]  EPINEPHrine  0.3 mg/0.3 mL IJ SOAJ injection INJECT 0.3 ML INTO THE MUSCLE ONCE FOR 1 DOSE 06/30/17   [provider]  HYDROcodone  bit-homatropine (HYCODAN) 5-1.5 MG/5ML syrup Take 5 mLs by mouth every 6 (six) hours as needed for cough. 06/15/23   Johnell Landowski, FNP  metoprolol  succinate (TOPROL -XL) 25 MG 24 hr tablet TAKE 1 TABLET BY MOUTH EVERY DAY 08/28/19   Curtis Debby PARAS, MD  pantoprazole (PROTONIX) 40 MG tablet Take by mouth. 10/12/17   [provider]  predniSONE  (DELTASONE ) 20 MG tablet Take 3 tabs PO daily x 5 days. 06/15/23   Ignatz Deis, FNP  DULoxetine  (CYMBALTA ) 60 MG capsule TAKE 1 CAPSULE (60 MG TOTAL) BY MOUTH AT BEDTIME. 07/31/18 02/21/19  Tommas Severa Norris, PA-C  hyoscyamine  (LEVSIN ) 0.125 MG tablet Take 1 tablet (0.125 mg total) by mouth every 4 (four) hours as needed for cramping (abdominal pain). 06/21/18 02/21/19  Tommas Severa Norris, PA-C  traZODone  (DESYREL ) 50 MG tablet TAKE 0.5-1 TABLETS (25-50 MG TOTAL) BY MOUTH AT BEDTIME AS NEEDED FOR SLEEP. 07/31/18 02/21/19  Tommas Severa Norris, PA-C    Family History Family History  Problem Relation Age of Onset   Depression Mother    Heart disease Maternal Grandmother    Depression Maternal Grandmother    Breast cancer Paternal Grandmother     Social History Social History   Tobacco Use   Smoking status: Never   Smokeless tobacco: Never  Vaping Use   Vaping status: Former   Substance Use Topics   Alcohol use: Yes    Comment: rare   Drug use: Yes    Types: Marijuana     Allergies   Pineapple, Strawberry extract, Gabapentin, and Imipramine    Review of Systems Review of Systems  Skin:  Positive for wound.     Physical Exam Triage Vital Signs ED Triage Vitals  Encounter Vitals Group     BP 10/24/23 1812 109/72     Girls Systolic BP Percentile --      Girls Diastolic BP Percentile --      Boys Systolic BP Percentile --      Boys Diastolic BP Percentile --      Pulse Rate 10/24/23 1812 91     Resp 10/24/23 1812 16     Temp 10/24/23 1812 99.3 F (37.4 C)     Temp Source 10/24/23 1812 Oral     SpO2 10/24/23 1812 100 %     Weight --      Height --      Head Circumference --      Peak Flow --      Pain Score 10/24/23 1813 0     Pain Loc --      Pain Education --      Exclude from Growth Chart --    No data found.  Updated Vital Signs BP 109/72   Pulse 91   Temp 99.3 F (37.4 C) (Oral)   Resp 16   LMP 09/25/2023 (Approximate)   SpO2 100%    Physical Exam Vitals and nursing note reviewed.  Constitutional:      Appearance: Normal appearance. She is normal weight.  HENT:     Head: Normocephalic and atraumatic.     Mouth/Throat:     Mouth: Mucous membranes are moist.     Pharynx: Oropharynx is clear.  Eyes:     Extraocular Movements: Extraocular movements intact.     Pupils: Pupils are equal, round, and reactive to light.  Cardiovascular:     Rate and Rhythm: Normal rate and regular rhythm.     Pulses: Normal pulses.     Heart sounds: Normal heart sounds.  Pulmonary:     Effort: Pulmonary effort is normal.     Breath sounds: Normal breath sounds. No wheezing, rhonchi or rales.  Musculoskeletal:        General: Normal range of motion.     Cervical back: Normal range of motion and neck supple.  Skin:    General: Skin is warm and dry.  Neurological:     General: No focal deficit present.     Mental Status: She is alert and  oriented to person, place, and time. Mental status is at baseline.  Psychiatric:  Mood and Affect: Mood normal.        Behavior: Behavior normal.      UC Treatments / Results  Labs (all labs ordered are listed, but only abnormal results are displayed) Labs Reviewed - No data to display  EKG   Radiology No results found.  Procedures Procedures (including critical care time)  Medications Ordered in UC Medications  Tdap (BOOSTRIX ) injection 0.5 mL (0.5 mLs Intramuscular Given 10/24/23 1836)    Initial Impression / Assessment and Plan / UC Course  I have reviewed the triage vital signs and the nursing notes.  Pertinent labs & imaging results that were available during my care of the patient were reviewed by me and considered in my medical decision making (see chart for details).     MDM: 1.  Injury of right thumb, initial encounter-Rx'd Augmentin  875/125 mg tablet: Take 1 tablet twice daily x 7; 2.  Puncture wound of right thumb, initial encounter-Tdap booster X injection 0.5 mL given once IM in clinic, Rx'd Augmentin  875/125 mg tablet: Take 1 tablet twice daily x 7 days. Advised patient to medication as directed with food to completion. Encouraged increase daily water intake to 64 ounces per day while taking this medication.  Advised if symptoms worsen and/or unresolved please follow-up with your PCP or here for further evaluation.  Patient discharged home, hemodynamically stable. Final Clinical Impressions(s) / UC Diagnoses   Final diagnoses:  None   Discharge Instructions   None    ED Prescriptions   None    PDMP not reviewed this encounter.   Teddy Sharper, FNP 10/24/23 AMOS

## 2023-10-24 NOTE — Discharge Instructions (Addendum)
 Advised patient to medication as directed with food to completion. Encouraged increase daily water intake to 64 ounces per day while taking this medication.  Advised if symptoms worsen and/or unresolved please follow-up with your PCP or here for further evaluation.

## 2023-10-25 ENCOUNTER — Telehealth: Payer: Self-pay

## 2023-10-25 MED ORDER — AMOXICILLIN-POT CLAVULANATE 875-125 MG PO TABS: 1.0000 | ORAL_TABLET | Freq: Two times a day (BID) | ORAL | 0 refills | Status: DC

## 2023-10-25 NOTE — Telephone Encounter (Signed)
Rx resent to pharmacy per pt request.

## 2024-04-05 ENCOUNTER — Ambulatory Visit
Admission: RE | Admit: 2024-04-05 | Discharge: 2024-04-05 | Disposition: A | Discharge status: Home / Self Care | Attending: Family Medicine | Admitting: Family Medicine

## 2024-04-05 VITALS — BP 112/85 | HR 93 | Temp 98.9°F | Resp 16

## 2024-04-05 DIAGNOSIS — R3915 Urgency of urination: Secondary | ICD-10-CM | POA: Diagnosis present

## 2024-04-05 DIAGNOSIS — Z113 Encounter for screening for infections with a predominantly sexual mode of transmission: Secondary | ICD-10-CM | POA: Insufficient documentation

## 2024-04-05 LAB — POCT URINALYSIS DIP (MANUAL ENTRY)
Glucose, UA: NEGATIVE mg/dL
Ketones, POC UA: NEGATIVE mg/dL
Nitrite, UA: NEGATIVE
Protein Ur, POC: >=300 mg/dL — AB
Spec Grav, UA: >=1.03 — AB
Urobilinogen, UA: 1 U/dL
pH, UA: 5.5

## 2024-04-05 LAB — POCT URINE PREGNANCY: Preg Test, Ur: NEGATIVE

## 2024-04-05 MED ORDER — NITROFURANTOIN MONOHYD MACRO 100 MG PO CAPS: ORAL_CAPSULE | ORAL | 0 refills | Status: AC

## 2024-04-05 NOTE — ED Provider Notes (Signed)
 " [facility]    CSN: [id number] Arrival date & time: 04/05/24  1832      History   Chief Complaint Chief Complaint  Patient presents with   Urinary Frequency    Entered by patient    HPI Christina Munoz is a 24 y.o. female.   Patient complains of urinary urgency and bladder pressure for about two weeks, but denies dysuria and pelvic/abdominal/flank pain.  She denies vaginal discharge, nausea/vomiting and fevers, chills, and sweats.  However, she notes that she has a history of BV and wonders if that is causing her symptoms.  She admits that she has a new sexual partner, but doubts that she has an STD.  Patient's last menstrual period was 03/16/2024 (exact date).   The history is provided by the patient.    Past Medical History:  Diagnosis Date   History of acute pancreatitis    History of gallstones    Hx of migraine headaches    Lumbar degenerative disc disease 01/23/2018   PUD (peptic ulcer disease)    Suicidal ideations 01/03/2018    Patient Active Problem List   Diagnosis Date Noted   Irritable bowel syndrome with both constipation and diarrhea 05/09/2018   Insomnia secondary to anxiety 05/09/2018   Sinus tachycardia by electrocardiogram 03/09/2018   Palpitations 03/09/2018   Generalized abdominal pain 03/05/2018   GAD (generalized anxiety disorder) 03/05/2018   Pelvic pain in female 01/30/2018   Dyspareunia in female 01/30/2018   Constipation 01/23/2018   Lumbar degenerative disc disease 01/23/2018   EBV infection 01/23/2018   Renal cyst, right 01/19/2018   Bilirubinuria 01/18/2018   Left axillary pain 01/11/2018   Encounter for counseling regarding contraception 01/11/2018   History of chlamydia infection 01/10/2018   History of migraine with aura 01/03/2018   Anxiety with depression 01/03/2018   Nicotine dependence 01/03/2018   History of acute pancreatitis     Past Surgical History:  Procedure Laterality Date   CHOLECYSTECTOMY      ESOPHAGOGASTRODUODENOSCOPY     WISDOM TOOTH EXTRACTION      OB History     Gravida  0   Para  0   Term  0   Preterm  0   AB  0   Living  0      SAB  0   IAB  0   Ectopic  0   Multiple  0   Live Births  0            Home Medications    Prior to Admission medications  Medication Sig Start Date End Date Taking? Authorizing Provider  FLUoxetine (PROZAC) 10 MG capsule Take by mouth. 01/30/24  Yes [provider]  nitrofurantoin , macrocrystal-monohydrate, (MACROBID ) 100 MG capsule Take one cap PO Q12hr with food. 04/05/24  Yes Pauline Garnette LABOR, MD  methylphenidate (RITALIN LA) 20 MG 24 hr capsule Take 20 mg by mouth every morning.    [provider]  DULoxetine  (CYMBALTA ) 60 MG capsule TAKE 1 CAPSULE (60 MG TOTAL) BY MOUTH AT BEDTIME. 07/31/18 02/21/19  Tommas Severa Norris, PA-C  hyoscyamine  (LEVSIN ) 0.125 MG tablet Take 1 tablet (0.125 mg total) by mouth every 4 (four) hours as needed for cramping (abdominal pain). 06/21/18 02/21/19  Tommas Severa Norris, PA-C  traZODone  (DESYREL ) 50 MG tablet TAKE 0.5-1 TABLETS (25-50 MG TOTAL) BY MOUTH AT BEDTIME AS NEEDED FOR SLEEP. 07/31/18 02/21/19  Tommas Severa Norris, PA-C    Family History Family History  Problem Relation Age  of Onset   Depression Mother    Heart disease Maternal Grandmother    Depression Maternal Grandmother    Breast cancer Paternal Grandmother     Social History Social History[1]   Allergies   Pineapple, Strawberry extract, Gabapentin, and Imipramine    Review of Systems Review of Systems  Constitutional:  Negative for activity change, appetite change, chills, diaphoresis, fatigue and fever.  Gastrointestinal:  Negative for abdominal pain, nausea and vomiting.  Genitourinary:  Positive for urgency. Negative for difficulty urinating, dysuria, flank pain, frequency, genital sores, hematuria, pelvic pain, vaginal bleeding and vaginal discharge.  All other  systems reviewed and are negative.    Physical Exam Triage Vital Signs ED Triage Vitals [04/05/24 1904]  Encounter Vitals Group     BP 112/85     Girls Systolic BP Percentile      Girls Diastolic BP Percentile      Boys Systolic BP Percentile      Boys Diastolic BP Percentile      Pulse Rate 93     Resp 16     Temp 98.9 F (37.2 C)     Temp Source Oral     SpO2 98 %     Weight      Height      Head Circumference      Peak Flow      Pain Score      Pain Loc      Pain Education      Exclude from Growth Chart    No data found.  Updated Vital Signs BP 112/85 (BP Location: Right Arm)   Pulse 93   Temp 98.9 F (37.2 C) (Oral)   Resp 16   LMP 03/16/2024 (Exact Date)   SpO2 98%   Visual Acuity Right Eye Distance:   Left Eye Distance:   Bilateral Distance:    Right Eye Near:   Left Eye Near:    Bilateral Near:     Physical Exam Nursing notes and Vital Signs reviewed. Appearance:  Patient appears stated age, and in no acute distress.    Eyes:  Pupils are equal, round, and reactive to light and accomodation.  Extraocular movement is intact.  Conjunctivae are not inflamed   Pharynx:  Normal; moist mucous membranes  Neck:  Supple.  No adenopathy Lungs:  Clear to auscultation.  Breath sounds are equal.  Moving air well. Heart:  Regular rate and rhythm without murmurs, rubs, or gallops.  Abdomen:  Nontender without masses or hepatosplenomegaly.  Bowel sounds are present.  No CVA or flank tenderness.  Extremities:  No edema.  Skin:  No rash present.     UC Treatments / Results  Labs (all labs ordered are listed, but only abnormal results are displayed) Labs Reviewed  POCT URINALYSIS DIP (MANUAL ENTRY) - Abnormal; Notable for the following components:      Result Value   Bilirubin, UA small (*)    Spec Grav, UA >=1.030 (*)    Blood, UA moderate (*)    Protein Ur, POC >=300 (*)    Leukocytes, UA Trace (*)    All other components within normal limits  POCT URINE  PREGNANCY - Normal  URINE CULTURE  CERVICOVAGINAL ANCILLARY ONLY    EKG   Radiology No results found.  Procedures Procedures (including critical Munoz time)  Medications Ordered in UC Medications - No data to display  Initial Impression / Assessment and Plan / UC Course  I have reviewed the triage vital signs  and the nursing notes.  Pertinent labs & imaging results that were available during my Munoz of the patient were reviewed by me and considered in my medical decision making (see chart for details).    Note trace leukocytes and moderate blood on urinalysis. Other lab tests above pending. Suspect cystitis. Begin empiric Macrobid . Followup with Family Doctor if not improved in one week.   Final Clinical Impressions(s) / UC Diagnoses   Final diagnoses:  Urinary urgency  Screening for STD (sexually transmitted disease)     Discharge Instructions      Increase fluid intake.  May use non-prescription AZO for about two days, if desired, to decrease urinary discomfort.   If symptoms become significantly worse during the night or over the weekend, proceed to the local emergency room.     ED Prescriptions     Medication Sig Dispense Auth. Provider   nitrofurantoin , macrocrystal-monohydrate, (MACROBID ) 100 MG capsule Take one cap PO Q12hr with food. 14 capsule Pauline Garnette LABOR, MD           [1]  Social History Tobacco Use   Smoking status: Never   Smokeless tobacco: Never  Vaping Use   Vaping status: Former  Substance Use Topics   Alcohol use: Yes    Comment: rare   Drug use: Yes    Types: Marijuana     Pauline Garnette LABOR, MD 04/06/24 1526  "

## 2024-04-05 NOTE — ED Triage Notes (Addendum)
 Pt reports she's been having some urinary urgency x 2 weeks. Pt denies lower abdominal and back pain, and burning upon urination. Pt would like to get checked for UTI. Pt reports she has chronic BV and would like to get tested for it

## 2024-04-05 NOTE — Discharge Instructions (Signed)
 Increase fluid intake. May use non-prescription AZO for about two days, if desired, to decrease urinary discomfort.  If symptoms become significantly worse during the night or over the weekend, proceed to the local emergency room.

## 2024-04-09 LAB — CERVICOVAGINAL ANCILLARY ONLY
Bacterial Vaginitis (gardnerella): POSITIVE — AB
Candida Glabrata: NEGATIVE
Candida Vaginitis: NEGATIVE
Chlamydia: NEGATIVE
Comment: NEGATIVE
Comment: NEGATIVE
Comment: NEGATIVE
Comment: NEGATIVE
Comment: NEGATIVE
Comment: NORMAL
Neisseria Gonorrhea: NEGATIVE
Trichomonas: NEGATIVE

## 2024-04-09 LAB — URINE CULTURE
Culture: NO GROWTH
Special Requests: NORMAL

## 2024-04-10 ENCOUNTER — Ambulatory Visit (HOSPITAL_COMMUNITY): Payer: Self-pay

## 2024-04-10 MED ORDER — METRONIDAZOLE 500 MG PO TABS: 500.0000 mg | ORAL_TABLET | Freq: Two times a day (BID) | ORAL | 0 refills | Status: AC
